# Patient Record
Sex: Male | Born: 1985 | Race: White | Hispanic: No | Smoking: Current every day smoker
Health system: Southern US, Community
[De-identification: ages and names within clinical notes are randomized; demographics above are authoritative.]

## PROBLEM LIST (undated history)

## (undated) DIAGNOSIS — K7200 Acute and subacute hepatic failure without coma: Secondary | ICD-10-CM

## (undated) DIAGNOSIS — K7010 Alcoholic hepatitis without ascites: Secondary | ICD-10-CM

## (undated) DIAGNOSIS — M542 Cervicalgia: Secondary | ICD-10-CM

## (undated) HISTORY — PX: CLEFT PALATE REPAIR: SUR1165

## (undated) HISTORY — PX: NECK SURGERY: SHX720

## (undated) HISTORY — PX: ABDOMINAL SURGERY: SHX537

---
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
## 2006-06-23 ENCOUNTER — Ambulatory Visit: Payer: Self-pay | Admitting: Psychiatry

## 2006-06-23 ENCOUNTER — Inpatient Hospital Stay (HOSPITAL_COMMUNITY): Admission: RE | Admit: 2006-06-23 | Discharge: 2006-06-26 | Payer: Self-pay | Admitting: Psychiatry

## 2013-10-23 ENCOUNTER — Emergency Department (HOSPITAL_COMMUNITY): Payer: Self-pay

## 2013-10-23 ENCOUNTER — Emergency Department (HOSPITAL_COMMUNITY)
Admission: EM | Admit: 2013-10-23 | Discharge: 2013-10-23 | Disposition: A | Discharge status: Home / Self Care | Payer: Self-pay | Attending: Emergency Medicine | Admitting: Emergency Medicine

## 2013-10-23 DIAGNOSIS — S99929A Unspecified injury of unspecified foot, initial encounter: Secondary | ICD-10-CM

## 2013-10-23 DIAGNOSIS — Y92410 Unspecified street and highway as the place of occurrence of the external cause: Secondary | ICD-10-CM | POA: Insufficient documentation

## 2013-10-23 DIAGNOSIS — M25512 Pain in left shoulder: Secondary | ICD-10-CM

## 2013-10-23 DIAGNOSIS — M79605 Pain in left leg: Secondary | ICD-10-CM

## 2013-10-23 DIAGNOSIS — S46909A Unspecified injury of unspecified muscle, fascia and tendon at shoulder and upper arm level, unspecified arm, initial encounter: Secondary | ICD-10-CM | POA: Insufficient documentation

## 2013-10-23 DIAGNOSIS — IMO0002 Reserved for concepts with insufficient information to code with codable children: Secondary | ICD-10-CM | POA: Insufficient documentation

## 2013-10-23 DIAGNOSIS — S99919A Unspecified injury of unspecified ankle, initial encounter: Secondary | ICD-10-CM

## 2013-10-23 DIAGNOSIS — Z79899 Other long term (current) drug therapy: Secondary | ICD-10-CM | POA: Insufficient documentation

## 2013-10-23 DIAGNOSIS — S0990XA Unspecified injury of head, initial encounter: Secondary | ICD-10-CM | POA: Insufficient documentation

## 2013-10-23 DIAGNOSIS — S8990XA Unspecified injury of unspecified lower leg, initial encounter: Secondary | ICD-10-CM | POA: Insufficient documentation

## 2013-10-23 DIAGNOSIS — T07XXXA Unspecified multiple injuries, initial encounter: Secondary | ICD-10-CM | POA: Insufficient documentation

## 2013-10-23 DIAGNOSIS — S4980XA Other specified injuries of shoulder and upper arm, unspecified arm, initial encounter: Secondary | ICD-10-CM | POA: Insufficient documentation

## 2013-10-23 DIAGNOSIS — Y9389 Activity, other specified: Secondary | ICD-10-CM | POA: Insufficient documentation

## 2013-10-23 MED ORDER — OXYCODONE-ACETAMINOPHEN 5-325 MG PO TABS: 1.0000 | ORAL_TABLET | ORAL | Status: DC | PRN

## 2013-10-23 MED ORDER — ONDANSETRON 8 MG PO TBDP
8.0000 mg | ORAL_TABLET | Freq: Once | ORAL | Status: AC
  Administered 2013-10-23: 8 mg via ORAL
  Filled 2013-10-23: qty 1

## 2013-10-23 MED ORDER — HYDROMORPHONE HCL PF 2 MG/ML IJ SOLN
2.0000 mg | Freq: Once | INTRAMUSCULAR | Status: AC
  Administered 2013-10-23: 2 mg via INTRAMUSCULAR
  Filled 2013-10-23: qty 1
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
## 2013-10-23 NOTE — ED Notes (Signed)
Also has pain in nose and facial area and head.
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
## 2013-10-23 NOTE — Discharge Instructions (Signed)

## 2014-01-16 ENCOUNTER — Emergency Department (HOSPITAL_BASED_OUTPATIENT_CLINIC_OR_DEPARTMENT_OTHER): Payer: Self-pay

## 2014-01-16 ENCOUNTER — Emergency Department (HOSPITAL_BASED_OUTPATIENT_CLINIC_OR_DEPARTMENT_OTHER)
Admission: EM | Admit: 2014-01-16 | Discharge: 2014-01-16 | Disposition: A | Discharge status: Home / Self Care | Payer: Self-pay | Attending: Emergency Medicine | Admitting: Emergency Medicine

## 2014-01-16 ENCOUNTER — Encounter (HOSPITAL_BASED_OUTPATIENT_CLINIC_OR_DEPARTMENT_OTHER): Payer: Self-pay | Admitting: Emergency Medicine

## 2014-01-16 DIAGNOSIS — S161XXA Strain of muscle, fascia and tendon at neck level, initial encounter: Secondary | ICD-10-CM | POA: Insufficient documentation

## 2014-01-16 DIAGNOSIS — Y9389 Activity, other specified: Secondary | ICD-10-CM | POA: Insufficient documentation

## 2014-01-16 DIAGNOSIS — Z720 Tobacco use: Secondary | ICD-10-CM | POA: Insufficient documentation

## 2014-01-16 DIAGNOSIS — Z79899 Other long term (current) drug therapy: Secondary | ICD-10-CM | POA: Insufficient documentation

## 2014-01-16 DIAGNOSIS — Y9289 Other specified places as the place of occurrence of the external cause: Secondary | ICD-10-CM | POA: Insufficient documentation

## 2014-01-16 DIAGNOSIS — M62830 Muscle spasm of back: Secondary | ICD-10-CM | POA: Insufficient documentation

## 2014-01-16 DIAGNOSIS — X58XXXA Exposure to other specified factors, initial encounter: Secondary | ICD-10-CM | POA: Insufficient documentation

## 2014-01-16 MED ORDER — OXYCODONE-ACETAMINOPHEN 5-325 MG PO TABS: 1.0000 | ORAL_TABLET | Freq: Four times a day (QID) | ORAL | Status: DC | PRN

## 2014-01-16 MED ORDER — DIAZEPAM 5 MG PO TABS: 5.0000 mg | ORAL_TABLET | Freq: Three times a day (TID) | ORAL | Status: DC | PRN

## 2014-01-16 MED ORDER — DIAZEPAM 5 MG PO TABS
5.0000 mg | ORAL_TABLET | Freq: Once | ORAL | Status: AC
  Administered 2014-01-16: 5 mg via ORAL
  Filled 2014-01-16: qty 1

## 2014-01-16 MED ORDER — HYDROMORPHONE HCL 1 MG/ML IJ SOLN
1.0000 mg | Freq: Once | INTRAMUSCULAR | Status: AC
  Administered 2014-01-16: 1 mg via INTRAMUSCULAR
  Filled 2014-01-16: qty 1

## 2014-01-16 NOTE — ED Provider Notes (Signed)
CSN: [id number]     Arrival date & time 01/16/14  1116 History   First MD Initiated Contact with Patient 01/16/14 1202     Chief Complaint  Patient presents with  . Neck Pain     (Consider location/radiation/quality/duration/timing/severity/associated sxs/prior Treatment) Patient is a 28 y.o. male presenting with neck pain. The history is provided by the patient.  Neck Pain Pain location:  Occipital region, L side and R side Quality:  Aching and cramping Radiates to: down entire back. Pain severity:  Moderate Pain is:  Same all the time Onset quality:  Sudden Timing:  Constant Progression:  Unchanged Chronicity:  New Context comment:  Was on the running boards of a truck rocking the truck back and forth and then felt a jolt of pain in his neck. No head trauma Relieved by:  Nothing Worsened by:  Sneezing, swallowing and twisting Ineffective treatments:  None tried Associated symptoms: no bladder incontinence, no bowel incontinence, no chest pain, no fever, no headaches, no numbness, no visual change and no weakness   Risk factors: no hx of osteoporosis, no hx of spinal trauma, no recent epidural and no recent head injury     History reviewed. No pertinent past medical history. History reviewed. No pertinent past surgical history. History reviewed. No pertinent family history. History  Substance Use Topics  . Smoking status: Current Every Day Smoker  . Smokeless tobacco: Not on file  . Alcohol Use: Not on file    Review of Systems  Constitutional: Negative for fever.  Cardiovascular: Negative for chest pain.  Gastrointestinal: Negative for bowel incontinence.  Genitourinary: Negative for bladder incontinence.  Musculoskeletal: Positive for neck pain.  Neurological: Negative for weakness, numbness and headaches.  All other systems reviewed and are negative.     Allergies  Review of patient's allergies indicates no known allergies.  Home Medications   Prior to  Admission medications   Medication Sig Start Date End Date Taking? Authorizing Provider  cholecalciferol (VITAMIN D) 1000 UNITS tablet Take 1,000 Units by mouth daily.    Historical Provider, MD  CREATINE PO Take by mouth daily.    Historical Provider, MD  Cyanocobalamin (B-12 PO) Take 1 tablet by mouth daily.    Historical Provider, MD  GLUTAMINE PO Take 1 tablet by mouth daily.    Historical Provider, MD  OVER THE COUNTER MEDICATION Take 1 capsule by mouth daily. ADDIUM    Historical Provider, MD  oxyCODONE-acetaminophen (PERCOCET) 5-325 MG per tablet Take 1 tablet by mouth every 4 (four) hours as needed for severe pain. 10/23/13   Flint MelterElliott L Wentz, MD  Pyridoxine HCl (B-6 PO) Take 1 tablet by mouth daily.    Historical Provider, MD   There were no vitals taken for this visit. Physical Exam  Nursing note and vitals reviewed. Constitutional: He is oriented to person, place, and time. He appears well-developed and well-nourished. No distress.  HENT:  Head: Normocephalic and atraumatic.  Mouth/Throat: No oropharyngeal exudate.  Eyes: EOM are normal. Pupils are equal, round, and reactive to light.  Neck: Normal range of motion. Neck supple.  Cardiovascular: Normal rate and regular rhythm.  Exam reveals no friction rub.   No murmur heard. Pulmonary/Chest: Effort normal and breath sounds normal. No respiratory distress. He has no wheezes. He has no rales.  Abdominal: He exhibits no distension. There is no tenderness. There is no rebound.  Musculoskeletal: He exhibits no edema.       Cervical back: He exhibits decreased range of motion,  tenderness, bony tenderness and spasm.       Thoracic back: He exhibits decreased range of motion, tenderness, bony tenderness and spasm.  Neurological: He is alert and oriented to person, place, and time. No cranial nerve deficit. He exhibits normal muscle tone. Coordination normal.  Skin: Skin is warm. He is not diaphoretic.    ED Course  Procedures  (including critical care time) Labs Review Labs Reviewed - No data to display  Imaging Review Dg Thoracic Spine 4v  01/16/2014   CLINICAL DATA:  Upper back pain after something fell from shelf and hit him.  EXAM: THORACIC SPINE - 4+ VIEW  COMPARISON:  None.  FINDINGS: There is no evidence of thoracic spine fracture. Alignment is normal. No other significant bone abnormalities are identified.  IMPRESSION: Normal thoracic spine.   Electronically Signed   By: Roque LiasJames  Green M.D.   On: 01/16/2014 14:00   Ct Cervical Spine Wo Contrast  01/16/2014   CLINICAL DATA:  Patient states that he was getting something from over his head and the weight came down pretty fast and heavy causing immediate neck pains, has increased pains at base of skull, upper neck, hurts worse to be upright, no other complaints  EXAM: CT CERVICAL SPINE WITHOUT CONTRAST  TECHNIQUE: Multidetector CT imaging of the cervical spine was performed without intravenous contrast. Multiplanar CT image reconstructions were also generated.  COMPARISON:  None.  FINDINGS: The alignment is anatomic. The vertebral body heights are maintained. There is no acute fracture. There is no static listhesis. The prevertebral soft tissues are normal. The intraspinal soft tissues are not fully imaged on this examination due to poor soft tissue contrast, but there is no gross soft tissue abnormality.  The disc spaces are maintained.  The visualized portions of the lung apices demonstrate no focal abnormality.  IMPRESSION: No acute osseous injury of the cervical spine.   Electronically Signed   By: Elige KoHetal  Patel   On: 01/16/2014 13:36     EKG Interpretation None      MDM   Final diagnoses:  Neck strain, initial encounter    15M with neck injury. Was rocking a pickup truck and felt a jolt of pain. Pain radiates down into his spine. No difficulty breathing, no numbness/weakess. AFVSS. NVI in all extremities. Spasms present in bilateral traps, has midline bony  pain. Will xray his T-spine and CT his c-spine. All imaging ok. Per nursing, patient sitting up and eating [employer] without problem. Patient tells me he continues to have pain with movements. Will leave in c-collar. [facility], but he declined. I cautioned him he cannot work while under the influence of the meds I will give him. Patient going home, given pain meds, muscle relaxers, resource guide to help establish a PCP.  Elwin MochaBlair Shene Maxfield, MD 01/16/14 [phone]

## 2014-01-16 NOTE — Discharge Instructions (Signed)
Soft Tissue Injury of the Neck °A soft tissue injury of the neck may be either blunt or penetrating. A blunt injury does not break the skin. A penetrating injury breaks the skin, creating an open wound. Blunt injuries may happen in several ways. Most involve some type of direct blow to the neck. This can cause serious injury to the windpipe, voice box, cervical spine, or esophagus. In some cases, the injury to the soft tissue can also result in a break (fracture) of the cervical spine.  °Soft tissue injuries of the neck require immediate medical care. Sometimes, you may not notice the signs of injury right away. You may feel fine at first, but the swelling may eventually close off your airway. This could result in a significant or life-threatening injury. This is rare, but it is important to keep in mind with any injury to the neck.  °CAUSES  °Causes of blunt injury may include: °· "Clothesline" injuries. This happens when someone is moving at high speed and runs into a clothesline, outstretched arm, or similar object. This results in a direct injury to the front of the neck. If the airway is blocked, it can cause suffocation due to lack of oxygen (asphyxiation) or even instant death. °· High-energy trauma. This includes injuries from motor vehicle crashes, falling from a great height, or heavy objects falling onto the neck. °· Sports-related injuries. Injury to the windpipe and voice box can result from being struck by another player or being struck by an object, such as a baseball, hockey stick, or an outstretched arm. °· Strangulation. This type of injury may cause skin trauma, hoarseness of voice, or broken cartilage in the voice box or windpipe. It may also cause a serious airway problem. °SYMPTOMS  °· Bruising. °· Pain and tenderness in the neck. °· Swelling of the neck and face. °· Hoarseness of voice. °· Pain or difficulty with swallowing. °· Drooling or inability to swallow. °· Trouble breathing. This may  become worse when lying flat. °· Coughing up blood. °· High-pitched, harsh, vibratory noise due to partial obstruction of the windpipe (stridor). °· Swelling of the upper arms. °· Windpipe that appears to be pushed off to one side. °· Air in the tissues under the skin of the neck or chest (subcutaneous emphysema). This usually indicates a problem with the normal airway and is a medical emergency. °DIAGNOSIS  °· If possible, your caregiver may ask about the details of how the injury occurred. A detailed exam can help to identify specific areas of the neck that are injured. °· Your caregiver may ask for tests to rule out injury of the voice box, airway, or esophagus. This may include X-rays, ultrasounds, CT scans, or MRI scans, depending on the severity of your injury. °TREATMENT  °If you have an injury to your windpipe or voice box, immediate medical care is required. In almost all cases, hospitalization is necessary. For injuries that do not appear to require surgery, it is helpful to have medical observation for 24 hours. You may be asked to do one or more of the following: °· Rest your voice. °· Bed rest. °· Limit your diet, depending on the extent of the injury. Follow your caregiver's dietary guidelines. Often, only fluids and soft foods are recommended. °· Keep your head raised. °· Breathe humidified air. °· Take medicines to control infection, reduce swelling, and reduce normal stomach acid. You may also need pain medicine, depending on your injury. °For injuries that appear to require surgery,   you will need to stay in the hospital. The exact type of procedure needed will depend on your exact injury or injuries.  °HOME CARE INSTRUCTIONS  °· If the skin was broken, keep the wound area clean and dry. Wear your bandage (dressing) and care for your wound as instructed. °· Follow your caregiver's advice about your diet. °· Follow your caregiver's advice about use of your voice. °· Take medicines as  directed. °· Keep your head and neck at least partially raised (elevated) while recovering. This should also be done while sleeping. °SEEK MEDICAL CARE IF:  °· Your voice becomes weaker. °· Your swelling or bruising is not improving as expected. Typically, this takes several days to improve. °· You feel that you are having problems with medicines prescribed. °· You have drainage from the injury site. This may be a sign that your wound is not healing properly or is infected. °· You develop increasing pain or difficulty while swallowing. °· You develop an oral temperature of 102° F (38.9° C) or higher. °SEEK IMMEDIATE MEDICAL CARE IF:  °· You cough up blood. °· You develop sudden trouble breathing. °· You cannot tolerate your oral medicines, or you are unable to swallow. °· You develop drooling. °· You have new or worsening vomiting. °· You develop sudden, new swelling of the neck or face. °· You have an oral temperature above 102° F (38.9° C), not controlled by medicine. °MAKE SURE YOU: °· Understand these instructions. °· Will watch your condition. °· Will get help right away if you are not doing well or get worse. °Document Released: 06/28/2007 Document Revised: 06/13/2011 Document Reviewed: 06/07/2010 °ExitCare® Patient Information ©2015 [employer]. This information is not intended to replace advice given to you by your health care provider. Make sure you discuss any questions you have with your health care provider. ° ° °Emergency Department Resource Guide °1) Find a Doctor and Pay Out of Pocket °Although you won't have to find out who is covered by your insurance plan, it is a good idea to ask around and get recommendations. You will then need to call the office and see if the doctor you have chosen will accept you as a new patient and what types of options they offer for patients who are self-pay. Some doctors offer discounts or will set up payment plans for their patients who do not have insurance, but you  will need to ask so you aren't surprised when you get to your appointment. ° °2) Contact Your Local Health Department °Not all health departments have doctors that can see patients for sick visits, but many do, so it is worth a call to see if yours does. If you don't know where your local health department is, you can check in your phone book. The CDC also has a tool to help you locate your state's health department, and many state websites also have listings of all of their local health departments. ° °3) Find a Walk-in Clinic °If your illness is not likely to be very severe or complicated, you may want to try a walk in clinic. These are popping up all over the country in pharmacies, drugstores, and shopping centers. They're usually staffed by nurse practitioners or physician assistants that have been trained to treat common illnesses and complaints. They're usually fairly quick and inexpensive. However, if you have serious medical issues or chronic medical problems, these are probably not your best option. ° °No Primary Care Doctor: °- Call [facility] at    832-8000 - they can help you locate a primary care doctor that  accepts your insurance, provides certain services, etc. °- Physician Referral Service- 1-800-533-3463 ° °Chronic Pain Problems: °Organization         Address  Phone   Notes  °[facility]  (336) 297-2271 Patients need to be referred by their primary care doctor.  ° °Medication Assistance: °Organization         Address  Phone   Notes  °[place] [facility] [address] °[place], [place] [address] (336) 641-8030 --Must be a resident of [place] °-- Must have NO insurance coverage whatsoever (no Medicaid/ Medicare, etc.) °-- The pt. MUST have a primary care doctor that directs their care regularly and follows them in the community °  °[facility]  (866) 331-1348   °[employer]  (888) 892-1162   ° °Agencies that provide inexpensive medical  care: °Organization         Address  Phone   Notes  °[facility]  (336) 832-8035   °[facility]    (336) 832-7272   °[facility] [address] °[place], [place] [address] (336) 832-4777   °[facility] [address], °[place] (336) 271-4999   °[facility]    (336) 373-0678   °[facility]    (336) 272-1050   °[facility] ° [address], [place] Phone:  (336) 832-4444, Fax:  (336) 832-4440 Hours of Operation:  9 am - 6 pm, M-F.  Also accepts Medicaid/Medicare and self-pay.  °[facility] ° [address], [place] Phone: (336) 832-3150, Fax: (336) 832-3151. Hours of Operation:  8:30 am - 5:30 pm, M-F.  Also accepts Medicaid and self-pay.  °[facility] [place] [address], [place] Phone: (336) 878-6027   °[facility] [address], [place], [place] (336)723-1848, Ext. 123 Mondays & Thursdays: 7-9 AM.  First 15 patients are seen on a first come, first serve basis. °  ° °Medicaid-accepting [place] Providers: ° °Organization         Address  Phone   Notes  °[facility] [address], [place] (336) 641-2100 Also accepts self-pay patients.  °[facility] [address], [place] ° (336) 856-9996   °[facility] [address], [place] (336) 288-8857   °[facility] [address] [place] [address], [place] (336) 299-7000   °Veita Bland [address], [place]  ° (336) 373-1557 Only accepts [place] Access Medicaid patients after they have their name applied to their card.  ° °Self-Pay (no insurance) in [place]: ° °Organization         Address  Phone   Notes  °Sickle Cell Patients, [facility] [address], [place] (336) 832-1970   °[facility] [address],  [place] (336) 832-4400   °[facility] ° [address], [place] (336) 992-4800   °[facility]/Dr. Osei-Bonsu ° [address] [place] [address], [place] or [address], [place] (336) [phone] Phone number for both [place] and [place] locations is the same.  °[facility] [address], [place] (336) 299-0000   °[facility] [address] [place] [address],    or [address] (336) 852-7530 °(336) 878-2260   °[facility] [address], [place] (336) 350-1642, phone; (336) 294-5005, fax Sees patients 1st and 3rd Saturday of every month.  Must not qualify for public or private insurance (i.e. Medicaid, Medicare, [place] Health Choice, Veterans' Benefits) • Household income should be no more than 200% of the poverty level •The clinic cannot treat you if you are pregnant or think you are pregnant • Sexually transmitted diseases are not treated at the clinic.  ° ° °Dental Care: °Organization         Address  Phone  Notes  °[facility] [address], [place] (336) 641-6152 Accepts children up to age 21 who are enrolled in Medicaid or [place] Health Choice; pregnant women with a Medicaid card; and children who have applied for Medicaid or [place] Health Choice, but were declined, whose parents can pay a reduced fee at time of service.  °[facility] [place]  [address], [place] (336) 641-7733 Accepts children up to age 21 who are enrolled in Medicaid or [place] Health Choice; pregnant women with a Medicaid card; and children who have applied for Medicaid or [place] Health Choice, but were declined, whose parents can pay a reduced fee at time of service.  °[facility] ° [address], [place] (336) 641-4533 Patients are seen by appointment only. Walk-ins are not accepted. [facility] will see patients 18 years of age and older. °Monday - Tuesday (8am-5pm) °Most Wednesdays (8:30-5pm) °$30 per visit, cash only  °[facility] ° [address], [place] (336) 641-4533 Patients are seen by appointment only. Walk-ins are not accepted. [facility] will see patients 18 years of age and older. °One Wednesday Evening (Monthly: Volunteer Based).  $30 per visit, cash only  °[facility]  (919) 537-3737 for adults; Children under age 4, call Graduate Pediatric Dentistry at ([phone]) [phone]. Children aged 4-14, please call (919) [phone] to request a pediatric application. ° Dental services are provided in all areas of dental care including fillings, crowns and bridges, complete and partial dentures, implants, gum treatment, root canals, and extractions. Preventive care is also provided. Treatment is provided to both adults and children. °Patients are selected via a lottery and there is often a waiting list. °  °[facility] [address], ° ° (336) 763-8833 [email or web address] °  °[facility] [address], [place], [place] (336)723-1848, Ext. 123 Second and Fourth Thursday of each month, opens at 6:30 AM; Clinic ends at 9 AM.  Patients are seen on a first-come first-served basis, and a limited number are seen during each clinic.  ° °[facility] ° [address], [place], [place] (336) 723-7904   Eligibility Requirements °You must have lived in [place], [place], or [place] counties for at least the last three months. °  You cannot be eligible for state or federal sponsored healthcare insurance, including Veterans Administration, Medicaid, or Medicare. °  You generally cannot be eligible for healthcare insurance through your employer.  °  How to apply: °Eligibility screenings are held every Tuesday and Wednesday afternoon from 1:00 pm until 4:00 pm. You do not need an appointment for the interview!   °[facility] [address], [place], [place] 336-631-2330   °[facility]  336-342-8273   °  [facility]  336-703-3100   °[facility]  336-570-6415   ° °Behavioral Health Resources in the Community: °Intensive Outpatient Programs °Organization         Address  Phone  Notes  °[facility] [address], [place], [place] 336-878-6098   °[facility] [address], [place], [place] 336-832-9800   °[facility] [address], [place], [place] ° 336-882-2125   °[facility] [address],  °[place], [place] 1-800-853-5163 or 336-641-4981   °Substance Abuse Resources °Organization         Address  Phone  Notes  °[facility]  336-882-2125   °[facility]  336-784-9470   °[facility]  336-285-9073   °[facility]  336-845-3988   °Residential & Outpatient Substance Abuse Program  1-800-659-3381   °Psychological Services °Organization         Address  Phone  Notes  °[facility]  336- 832-9600   °[facility]  336- 378-7881   °[facility] [address], [place] 1-800-853-5163 or 336-641-4981   ° °Mobile Crisis Teams °Organization         Address  Phone  Notes  °[facility], Mobile Crisis Care Unit  1-877-626-1772   °Assertive °[facility] ° [address] [place], [place] 336-834-9664   °Sharon DeEsch [address] °[place] 336-554-5454   ° °Self-Help/Support Groups °Organization         Address  Phone             Notes  °[facility] [place] - variety of support groups  336- 373-1402 Call for more information  °Narcotics Anonymous (NA), [facility] [address], °[place]  2 meetings at this location  ° °Residential Treatment Programs °Organization         Address  Phone  Notes  °[facility] [address],    °[place]  1-866-801-8205   °[facility] ° [address], [place], [place] 704-293-8524   °[facility] [address], [place] 336-845-3988 Admissions: 8am-3pm M-F  °[facility] [address]    °[place], [place] 336-841-1104   °[facility] [address], [place], [place] 336-379-7146   °[facility] [address]  °[place], [place] 336-285-9073   °[facility] Intensive Outpatient [address], [place], [place] 336-852-3033   °[facility] [address]  °[place], [place] 1-877-615-2722 or 336-784-9470   °[facility] [address] [place], [place] 336-227-7417 Accepts Medicaid  °[facility] [address]  °[place] 1-800-659-3381 Substance Abuse/Addiction Treatment  ° °[place] Behavioral Health Resources °Organization         Address  Phone  Notes  °[facility]  (888) 581-9988   °Julie Brannon, PhD [address] [place], [place]   (336) 349-5553 or (336) 951-0000   °[facility]   [address] °[place], [place] (336) 349-4454   °[facility] [address], [place], [place] (336) 342-8316 Insurance/Medicaid/sponsorship through [facility]  °[facility] [address]                                      [place], [place] (336) 342-8316 Therapy/tele-psych/case  °[facility] [address]  ° [place], [place] (336) 349-2233    °Dr. Arfeen  (336) 349-4544   °[facility] [place]  [employer] [place] [facility] 1) [address], [place] °2) [address], [place] °3)  [address], [place] (336) 349-3220 °(336) 342-7768 ° °(336) 342-8140   °[place] [facility] (336) 342-1394 or (336) 342-3537 (After Hours)    ° ° ° °

## 2014-01-16 NOTE — ED Notes (Signed)
Pt to room 7 in w/c, pt reports lifting heavy box last night, has had neck pain since then.

## 2014-01-21 ENCOUNTER — Encounter (HOSPITAL_COMMUNITY): Payer: Self-pay | Admitting: Emergency Medicine

## 2014-01-21 ENCOUNTER — Emergency Department (HOSPITAL_COMMUNITY): Payer: Self-pay

## 2014-01-21 ENCOUNTER — Emergency Department (HOSPITAL_COMMUNITY)
Admission: EM | Admit: 2014-01-21 | Discharge: 2014-01-21 | Disposition: A | Discharge status: Home / Self Care | Payer: Self-pay | Attending: Emergency Medicine | Admitting: Emergency Medicine

## 2014-01-21 DIAGNOSIS — S29002D Unspecified injury of muscle and tendon of back wall of thorax, subsequent encounter: Secondary | ICD-10-CM | POA: Insufficient documentation

## 2014-01-21 DIAGNOSIS — S199XXD Unspecified injury of neck, subsequent encounter: Secondary | ICD-10-CM | POA: Insufficient documentation

## 2014-01-21 DIAGNOSIS — M5020 Other cervical disc displacement, unspecified cervical region: Secondary | ICD-10-CM | POA: Insufficient documentation

## 2014-01-21 DIAGNOSIS — Z720 Tobacco use: Secondary | ICD-10-CM | POA: Insufficient documentation

## 2014-01-21 DIAGNOSIS — X58XXXD Exposure to other specified factors, subsequent encounter: Secondary | ICD-10-CM | POA: Insufficient documentation

## 2014-01-21 DIAGNOSIS — Z79899 Other long term (current) drug therapy: Secondary | ICD-10-CM | POA: Insufficient documentation

## 2014-01-21 MED ORDER — OXYCODONE-ACETAMINOPHEN 5-325 MG PO TABS
2.0000 | ORAL_TABLET | Freq: Once | ORAL | Status: AC
  Administered 2014-01-21: 2 via ORAL
  Filled 2014-01-21: qty 2

## 2014-01-21 MED ORDER — KETOROLAC TROMETHAMINE 30 MG/ML IJ SOLN
30.0000 mg | Freq: Once | INTRAMUSCULAR | Status: AC
  Administered 2014-01-21: 30 mg via INTRAMUSCULAR
  Filled 2014-01-21: qty 1

## 2014-01-21 MED ORDER — OXYCODONE-ACETAMINOPHEN 5-325 MG PO TABS: 1.0000 | ORAL_TABLET | ORAL | Status: DC | PRN

## 2014-01-21 NOTE — Discharge Instructions (Signed)
Return to the emergency room with worsening of symptoms, new symptoms or with symptoms that are concerning, especially fevers, loss of control of bladder or bowels, numbness or tingling around genital region or anus, weakness. RICE: Rest, Ice (three cycles of 20 mins on, 20mins off at least twice a day), compression/brace, elevation. Heating pad works well for back pain. Ibuprofen 400mg  (2 tablets 200mg ) every 5-6 hours for 3-5 days and then as needed for pain. Percocet for severe pain. Do not operate machinery, drive or drink alcohol while taking narcotics or muscle relaxers. Call to make appointment with neurosurgeon as soon as possible. Information provided above.

## 2014-01-21 NOTE — ED Notes (Signed)
Pt walked out; staff unaware; no clothes or belongings left in room
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
## 2014-01-21 NOTE — ED Notes (Signed)
Pt ambulated to restroom with steady gait.

## 2014-01-21 NOTE — ED Provider Notes (Signed)
CSN: [id number]     Arrival date & time 01/21/14  1042 History   First MD Initiated Contact with Patient 01/21/14 1156     Chief Complaint  Patient presents with  . Back Pain  . Neck Pain     (Consider location/radiation/quality/duration/timing/severity/associated sxs/prior Treatment) HPI 28 year old male who suffered a neck and back injury on 10/15 in which [facility]. He reports rocking a pickup truck and felt a jolt of pain that started from his neck and radiate down his spine. An x-ray of T-spine and a CT of his C-spine shows no acute finding. Patient was sent home with pain medication, and a c-collar, and recommendation for an MRI of the neck to rule out ligamentous injury. Patient declined MRI initially. He has been wearing his c-collar throughout the day and remove at nighttime. He continues to endorse sharp shooting pain from the base of his neck that spread out to both shoulder, worsening with neck movement including neck rotation, flexion and extension. Even sneezing or coughing aggravates his pain. He has been taking muscle relaxant and pain medication with some improvement. Patient is here with concern that his symptom has not improved as expected. He cannot perform his job with this neck pain. He denies any new numbness or weakness, or dropping objects. No fever, productive cough, trouble swallowing, chest pain or shortness of breath.      History reviewed. No pertinent past medical history. History reviewed. No pertinent past surgical history. History reviewed. No pertinent family history. History  Substance Use Topics  . Smoking status: Current Every Day Smoker  . Smokeless tobacco: Not on file  . Alcohol Use: Not on file    Review of Systems  Constitutional: Negative for fever.  Musculoskeletal: Positive for neck pain and neck stiffness.  Skin: Negative for rash and wound.  Neurological: Negative for numbness and headaches.       Allergies  Review of patient's allergies indicates no known allergies.  Home Medications   Prior to Admission medications   Medication Sig Start Date End Date Taking? Authorizing Provider  diazepam (VALIUM) 5 MG tablet Take 1 tablet (5 mg total) by mouth every 8 (eight) hours as needed for muscle spasms. 01/16/14  Yes Elwin MochaBlair Walden, MD  Menthol, Topical Analgesic, (ICY HOT) 5 % PADS Apply 1 each topically as needed (for pain).   Yes Historical Provider, MD  oxyCODONE-acetaminophen (PERCOCET/ROXICET) 5-325 MG per tablet Take 0.5-1 tablets by mouth every 6 (six) hours as needed for severe pain.   Yes Historical Provider, MD   BP 139/97  Pulse 80  Temp(Src) 98 F (36.7 C) (Oral)  Resp 18  Ht 5\' 10"  (1.778 m)  Wt 204 lb (92.534 kg)  BMI 29.27 kg/m2  SpO2 100% Physical Exam  Constitutional: He appears well-developed and well-nourished. No distress.  HENT:  Head: Atraumatic.  Eyes: Conjunctivae and EOM are normal. Pupils are equal, round, and reactive to light.  Neck: Neck supple.  Neck is stiff, left paracervical tenderness to palpation and tenderness along the left trapezius muscle. Decrease neck flexion extension and rotation secondary to pain. No overlying skin changes. No significant midline spine tenderness crepitus or step-off.  Cardiovascular: Intact distal pulses.   Neurological: He is alert.  Normal grip strength bilaterally  Skin: No rash noted.  Psychiatric: He has a normal mood and affect.    ED Course  Procedures (including critical care time)  12:11 PM Patient here with persistent  neck pain status post injury 5 days ago. He was recommended to have a neck MRI initially but declined because he has to take care of his kids. He is here because his neck pain is still significant. We'll obtain a neck MRI as recommended. Pain medication given. Patient is currently neurovascularly intact.    3:14 PM Currently awaits neck MRI.  I have called MRI tech who notify that  it will be 2 more hrs before pt can go to MRI.  Pt made aware of time delay.  Pt currently resting comfortably.  Pain medication offered.    Labs Review Labs Reviewed - No data to display  Imaging Review Mr Cervical Spine Wo Contrast  01/21/2014   CLINICAL DATA:  Patient was on the running board of a truck rocking the truck back and forth and felt a sudden jolt of pain in his neck on 01/15/2014. Neck and back pain since. Rule out ligamentous injury.  EXAM: MRI CERVICAL SPINE WITHOUT CONTRAST  TECHNIQUE: Multiplanar, multisequence MR imaging of the cervical spine was performed. No intravenous contrast was administered.  COMPARISON:  Cervical spine CT 01/16/2014  FINDINGS: There is straightening of the normal cervical lordosis, which may be positional or due to muscle spasm. There is no listhesis. Vertebral body heights are preserved. Mild multilevel disc desiccation is present. No definite ALL or PLL disruption is identified. No soft tissue edema suggestive of posterior ligamentous complex injury is identified. Vertebral bone marrow signal is within normal limits without evidence of edema. Craniocervical junction is unremarkable. Cervical spinal cord is normal in caliber and signal. Prevertebral soft tissues are mildly prominent from C5-C7, unchanged.  C2-3 through C4-5:  Negative.  C5-6: Shallow, broad posterior disc protrusion and mild uncovertebral spurring without stenosis.  C6-7: Small central disc protrusion approaches but does not contact or deform the spinal cord and does not result in stenosis.  C7-T1:  Negative.  IMPRESSION: 1. No acute ligamentous injury identified. 2. Small disc protrusions at C5-6 and C6-7 without stenosis.   Electronically Signed   By: Sebastian AcheAllen  Grady   On: 01/21/2014 18:13     EKG Interpretation None      MDM   Final diagnoses:  Neck injury, subsequent encounter  Protruded cervical disc    BP 142/82  Pulse 81  Temp(Src) 98 F (36.7 C) (Oral)  Resp 14  Ht 5\' 10"   (1.778 m)  Wt 204 lb (92.534 kg)  BMI 29.27 kg/m2  SpO2 95%  I have reviewed nursing notes and vital signs. I personally reviewed the imaging tests through PACS system  I reviewed available ER/hospitalization records thought the EMR     Fayrene HelperBowie Promise Weldin, [place]PA-C 01/23/14 1159

## 2014-01-23 NOTE — ED Provider Notes (Signed)
Medical screening examination/treatment/procedure(s) were performed by non-physician practitioner and as supervising physician I was immediately available for consultation/collaboration.   EKG Interpretation None        Samuel JesterKathleen Lehua Flores, DO 01/23/14 1733

## 2014-01-24 NOTE — ED Provider Notes (Signed)
Medical screening examination/treatment/procedure(s) were performed by non-physician practitioner and as supervising physician I was immediately available for consultation/collaboration.   EKG Interpretation None       Vanetta MuldersScott Jaryn Rosko, MD 01/24/14 [phone]

## 2014-10-28 ENCOUNTER — Encounter (HOSPITAL_BASED_OUTPATIENT_CLINIC_OR_DEPARTMENT_OTHER): Payer: Self-pay

## 2014-10-28 ENCOUNTER — Emergency Department (HOSPITAL_BASED_OUTPATIENT_CLINIC_OR_DEPARTMENT_OTHER)
Admission: EM | Admit: 2014-10-28 | Discharge: 2014-10-28 | Disposition: A | Discharge status: Home / Self Care | Payer: Self-pay | Attending: Emergency Medicine | Admitting: Emergency Medicine

## 2014-10-28 DIAGNOSIS — M542 Cervicalgia: Secondary | ICD-10-CM | POA: Insufficient documentation

## 2014-10-28 DIAGNOSIS — G8929 Other chronic pain: Secondary | ICD-10-CM | POA: Insufficient documentation

## 2014-10-28 DIAGNOSIS — Z720 Tobacco use: Secondary | ICD-10-CM | POA: Insufficient documentation

## 2014-10-28 HISTORY — DX: Cervicalgia: M54.2

## 2014-10-28 MED ORDER — METHOCARBAMOL 500 MG PO TABS: 1000.0000 mg | ORAL_TABLET | Freq: Four times a day (QID) | ORAL | Status: DC | PRN

## 2014-10-28 MED ORDER — KETOROLAC TROMETHAMINE 60 MG/2ML IM SOLN
30.0000 mg | Freq: Once | INTRAMUSCULAR | Status: AC
  Administered 2014-10-28: 30 mg via INTRAMUSCULAR
  Filled 2014-10-28: qty 2

## 2014-10-28 MED ORDER — OXYCODONE HCL 5 MG PO TABS
5.0000 mg | ORAL_TABLET | Freq: Once | ORAL | Status: AC
  Administered 2014-10-28: 5 mg via ORAL
  Filled 2014-10-28: qty 1

## 2014-10-28 MED ORDER — METHOCARBAMOL 500 MG PO TABS
1000.0000 mg | ORAL_TABLET | Freq: Once | ORAL | Status: AC
  Administered 2014-10-28: 1000 mg via ORAL
  Filled 2014-10-28: qty 2

## 2014-10-28 MED ORDER — OXYCODONE HCL 5 MG PO TABS
2.5000 mg | ORAL_TABLET | Freq: Four times a day (QID) | ORAL | Status: DC | PRN
Start: 2014-10-28 — End: 2016-12-04

## 2014-10-28 NOTE — ED Notes (Addendum)
C/o posterior neck pain after a certain movement 2 days ago at work-pain worse with movement-states hx of same approx 9 mos ago with ortho f/u

## 2014-10-28 NOTE — ED Provider Notes (Signed)
CSN: [id number]     Arrival date & time 10/28/14  1509 History   First MD Initiated Contact with Patient 10/28/14 1556     Chief Complaint  Patient presents with  . Neck Pain     (Consider location/radiation/quality/duration/timing/severity/associated sxs/prior Treatment) HPI   Blood pressure 141/80, pulse 99, temperature 98.6 F (37 C), temperature source Oral, resp. rate 18, height 5\' 10"  (1.778 m), weight 223 lb (101.152 kg), SpO2 100 %.  Jeffrey Braun is a 29 y.o. male complaining of severe posterior C-spine pain. Patient states he's had issues with his neck ever since he was rocking a large vehicle several months ago. He denies any numbness, weakness, the pain radiates down to the upper back. No pain medication taken prior to arrival. 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 He also just moved. This pain started 2 days ago. He states that he saw a orthopedist for this in the past but cannot remember the name.  Past Medical History  Diagnosis Date  . Neck pain    Past Surgical History  Procedure Laterality Date  . Cleft palate repair    . Abdominal surgery     No family history on file. History  Substance Use Topics  . Smoking status: Current Every Day Smoker  . Smokeless tobacco: Not on file  . Alcohol Use: Yes    Review of Systems  10 systems reviewed and found to be negative, except as noted in the HPI.   Allergies  Review of patient's allergies indicates no known allergies.  Home Medications   Prior to Admission medications   Medication Sig Start Date End Date Taking? Authorizing Provider  methocarbamol (ROBAXIN) 500 MG tablet Take 2 tablets (1,000 mg total) by mouth 4 (four) times daily as needed (Pain). 10/28/14   Jemel Ono, PA-C  oxyCODONE (ROXICODONE) 5 MG immediate release tablet Take 0.5-1 tablets (2.5-5 mg total) by mouth every 6 (six) hours as needed. 10/28/14   Akane Tessier, PA-C   BP 141/80  mmHg  Pulse 99  Temp(Src) 98.6 F (37 C) (Oral)  Resp 18  Ht 5\' 10"  (1.778 m)  Wt 223 lb (101.152 kg)  BMI 32.00 kg/m2  SpO2 100% Physical Exam  Constitutional: He is oriented to person, place, and time. He appears well-developed and well-nourished. No distress.  HENT:  Head: Normocephalic.  Eyes: Conjunctivae and EOM are normal. Pupils are equal, round, and reactive to light.  Neck:    No midline C-spine  tenderness to palpation or step-offs appreciated. Patient has full range of motion without pain.  Grip strength, biceps, triceps 5/5 bilaterally;  can differentiate between pinprick and light touch bilaterally  Reduced  range of motion and C-spine flexion bilaterally.   Cardiovascular: Normal rate.   Pulmonary/Chest: Effort normal. No stridor.  Musculoskeletal: Normal range of motion.  Neurological: He is alert and oriented to person, place, and time.  Psychiatric: He has a normal mood and affect.  Nursing note and vitals reviewed.   ED Course  Procedures (including critical care time) Labs Review Labs Reviewed - No data to display  Imaging Review No results found.   EKG Interpretation None      MDM   Final diagnoses:  Cervicalgia   Filed Vitals:   10/28/14 1519  BP: 141/80  Pulse: 99  Temp: 98.6 F (37 C)  TempSrc: Oral  Resp: 18  Height: 5\' 10"  (1.778 m)  Weight: 223 lb (101.152 kg)  SpO2: 100%    Medications  oxyCODONE (Oxy IR/ROXICODONE) immediate release tablet 5 mg (5 mg Oral Given 10/28/14 1615)  methocarbamol (ROBAXIN) tablet 1,000 mg (1,000 mg Oral Given 10/28/14 1615)  ketorolac (TORADOL) injection 30 mg (30 mg Intramuscular Given 10/28/14 1615)    Jeffrey Braun is a pleasant 29 y.o. male presenting with exacerbation of his chronic neck pain, pain is made worse with movement. His neuro exam is nonfocal, he has full strength and sensation to the bilateral upper extremities. 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 We've had an extensive discussion on pain medication at that he can't have any narcotics or muscle relaxers while he's working. Patient verbalized his understanding.  Evaluation does not show pathology that would require ongoing emergent intervention or inpatient treatment. Pt is hemodynamically stable and mentating appropriately. Discussed findings and plan with patient/guardian, who agrees with care plan. All questions answered. Return precautions discussed and outpatient follow up given.   New Prescriptions   METHOCARBAMOL (ROBAXIN) 500 MG TABLET    Take 2 tablets (1,000 mg total) by mouth 4 (four) times daily as needed (Pain).   OXYCODONE (ROXICODONE) 5 MG IMMEDIATE RELEASE TABLET    Take 0.5-1 tablets (2.5-5 mg total) by mouth every 6 (six) hours as needed.         Wynetta Emery, PA-C 10/28/14 1631  Rolland Porter, MD 11/01/14 [phone]

## 2014-10-28 NOTE — Discharge Instructions (Signed)
For pain control you may take:  800mg  of ibuprofen (that is usually 4 over the counter pills)  3 times a day (take with food) and acetaminophen 975mg  (this is 3 over the counter pills) four times a day. Do not drink alcohol or combine with other medications that have acetaminophen as an ingredient (Read the labels!).  F  Take oxycodone/Robaxin for breakthrough pain, do not drink alcohol, drive, care for children or do other critical tasks while taking oxycodone/Robaxin.  Do not hesitate to return to the emergency room for any new, worsening or concerning symptoms.  Please obtain primary care using resource guide below. Let them know that you were seen in the emergency room and that they will need to obtain records for further outpatient management.    Emergency Department Resource Guide 1) Find a Doctor and Pay Out of Pocket Although you won't have to find out who is covered by your insurance plan, it is a good idea to ask around and get recommendations. You will then need to call the office and see if the doctor you have chosen will accept you as a new patient and what types of options they offer for patients who are self-pay. Some doctors offer discounts or will set up payment plans for their patients who do not have insurance, but you will need to ask so you aren't surprised when you get to your appointment.  2) Contact Your Local Health Department Not all health departments have doctors that can see patients for sick visits, but many do, so it is worth a call to see if yours does. If you don't know where your local health department is, you can check in your phone book. The CDC also has a tool to help you locate your state's health department, and many state websites also have listings of all of their local health departments.  3) Find a Walk-in Clinic If your illness is not likely to be very severe or complicated, you may want to try a walk in clinic. These are popping up all over the country  in pharmacies, drugstores, and shopping centers. They're usually staffed by nurse practitioners or physician assistants that have been trained to treat common illnesses and complaints. They're usually fairly quick and inexpensive. However, if you have serious medical issues or chronic medical problems, these are probably not your best option.  No Primary Care Doctor: - Call Health Connect at  [phone] - they can help you locate a primary care doctor that  accepts your insurance, provides certain services, etc. - Physician Referral Service- [phone]  Chronic Pain Problems: Organization         Address  Phone   Notes  [facility]  [phone] Patients need to be referred by their primary care doctor.   Medication Assistance: Organization         Address  Phone   Notes  [facility] Medication [facility] [address] [place] [place], [place] [address] [phone] --Must be a resident of [facility] -- Must have NO insurance coverage whatsoever (no Medicaid/ Medicare, etc.) -- The pt. MUST have a primary care doctor that directs their care regularly and follows them in the community   [facility]ist  [phone]   [employer]  [phone]    Agencies that provide inexpensive medical care: Organization         Address  Phone   Notes  [facility]  [phone]  [facility]    [phone]   [facility] [place], [place] [address] [phone]   [place]. [address] [place] [phone]   Planned Parenthood    [phone]   [facility]    [phone]   [place] and [place] [address], [place] Phone:  [phone], Fax:  [phone] Hours of Operation:  9 am - 6 pm, M-F.  Also accepts Medicaid/Medicare and self-pay.  [facility] for [place] [address], [address],  [place] Phone: [phone], Fax: [phone]. Hours of Operation:  8:30 am - 5:30 pm, M-F.  Also accepts Medicaid and self-pay.  [facility] [place] [address] [place] Phone: [phone]   [place], [place], [place] [phone], Ext. [phone] Mondays & Thursdays: 7-9 AM.  First 15 patients are seen on a first come, first serve basis.    [place] Providers:  Organization         Address  Phone   Notes  [facility] [address], [place] [phone] Also accepts self-pay patients.  [facility] [address], [place]  [phone]   [place], [address], [place] [phone]   [facility] [address] [place] [phone]   Lucianne Lei [address], [place]   [phone] Only accepts [place] Access [place] patients after they have their name applied to their card.   Self-Pay (no insurance) in [facility]:  Organization         Address  Phone   Notes  Sickle Cell Patients, [facility] [place] [phone]   [facility] [place] [phone]   [facility] [place]  [place], [place], [place] [phone]   [facility]/Dr. Osei-Bonsu  [address], [place] or [address], [place] [phone] Phone number for both [place] and [place] locations is the same.  Urgent Medical and [facility] [address], [place] [phone]   [facility] [address] [place] or [address] [phone]   [facility] [address], [place] [phone], phone; [phone], fax Sees patients 1st and 3rd Saturday of every month.  Must not  qualify for public or private insurance (i.e. Medicaid, Medicare, [place] Health Choice, Veterans' Benefits)  Household income should be no more than 200% of the poverty level The clinic cannot treat you if you are pregnant or think you are pregnant  Sexually transmitted diseases are not treated at the clinic.    Dental Care: Organization         Address  Phone  Notes  [facility] Department of [facility] [phone] Accepts children up to age 37 who are enrolled in [place] or [place]; pregnant women with a Medicaid card; and children who have applied for Medicaid or [place] Health Choice, but were declined, whose parents can pay a reduced fee at time of service.  [facility] Department of [facility]  [address],  [place] [phone] Accepts children up to age 76 who are enrolled in Medicaid or [place] Health Choice; pregnant women with a Medicaid card; and children who have applied for Medicaid or  Health Choice, but were declined, whose parents can pay a reduced fee at time of service.  [place] [facility]  [place] [phone] Patients are seen by appointment only. Walk-ins are not accepted. [place] will see patients 62 years of age and older. Monday - Tuesday (8am-5pm) Most Wednesdays (8:30-5pm) $30 per visit, cash only  [facility] Adult [facility] Access PROGRAM  [address], [facility] [phone] Patients are seen by appointment only. Walk-ins are not accepted. [place] will see patients 15 years of age and older. One Wednesday Evening (Monthly: Volunteer Based).  $30 per visit, cash only  [place]  [phone] for adults; Children under age 58, call Graduate Pediatric Dentistry at [phone]. Children aged 16-14, please call [phone] to request a pediatric application.  Dental services are provided  in all areas of dental care including fillings, crowns and bridges, complete and partial dentures, implants, gum treatment, root canals, and extractions. Preventive care is also provided. Treatment is provided to both adults and children. Patients are selected via a lottery and there is often a waiting list.   [facility] [address] [place]  [phone] [email or web address]   [facility] [address] [place], [place] [phone], Ext. [phone] Second and Fourth Thursday of each month, opens at 6:30 AM; Clinic ends at 9 AM.  Patients are seen on a first-come first-served basis, and a limited number are seen during each clinic.   [facility]  [address] Hillard Danker [place], [place] [phone]   Eligibility Requirements You must have lived in [place], [place], or [place] counties for at least the last three months.   You cannot be eligible for state or federal sponsored [employer], including [employer], [place], or [employer].   You generally cannot be eligible for healthcare insurance through your employer.    How to apply: Eligibility screenings are held every Tuesday and Wednesday afternoon from 1:00 pm until 4:00 pm. You do not need an appointment for the interview!  [facility] [address] [employer], [place]   [place]  [place] Department  [place]  [phone]    Behavioral Health Resources in the Community: Intensive Outpatient Programs Organization         Address  Phone  Notes  [place]. [address], [place], [place] [phone]   [facility] Outpatient [address] [place], [place]   ADS: [facility] [address], [place], [place]   [place] [address],  [place], [place] or [phone]   Substance Abuse Resources Organization         Address  Phone  Notes  Alcohol and Drug Services  [phone]   [place]  [phone]   [facility] [place]   Chinita Pester  [phone]   Residential & Outpatient Substance Abuse Program  [phone]   Psychological Services Organization         Address  Phone  Notes  [place]  [place]  [phone]  [phone]   [facility] Mental Health [address], [place] [phone] or [phone]    Mobile Crisis Teams Organization         Address  Phone  Notes  [facility], Mobile Crisis Care Unit  [phone]   [facility]  [address] [place], [place] [phone]   Doristine Locks [address] [place] [phone]    Self-Help/Support Groups Organization         Address  Phone             Notes  [facility] [place] - variety of support groups  336- I7437963 Call for more information  Narcotics Anonymous (NA), [facility] [address], [employer] [place]  2 meetings at this location   Statistician         Address  Phone  Notes  [facility] [address],    [place]  [phone]   [facility]  [address] [place] [address], [place], [place] [phone]   [facility] [address] [place], [place] [phone] Admissions: 8am-3pm M-F  [facility] [address]    [place], [place] [phone]   [facility] [address] [place], [place], [place] [phone]   [facility] [address]  [place], [place] [phone]   [facility] - Intensive Outpatient [address] Laurell [address], [place], [place] [phone]   [facility] ([facility] [address] [place].,  [place], [place] [phone] or  [phone]   [facility] ([facility]) [address] [place], [place] [phone] Accepts Medicaid  [facility] [address]  [place] [phone] Substance Abuse/Addiction Treatment   [facility] Organization         Address  Phone  Notes  [facility]  [phone]   Angie Fava, PhD [address] Ervin Knack [place], [place]   [phone] or [phone]   [facility] Behavioral   [address] [place], [place] [phone]   [facility] [address] [place], [place] [phone] Insurance/Medicaid/sponsorship through [facility] and Families [address]                                    [place], [place] [phone] Therapy/tele-psych/case  [facility] [address], [place] [phone]    Dr. Lolly Mustache  [phone]   [facility] [place]  [employer] [facility] Dept. 1) [address] [place] 2) [address] [place] 3)  [address], [place] [phone]  [phone]   [facility] Child Abuse Hotline (276)639-7145 or [phone] (After Hours)

## 2015-03-03 ENCOUNTER — Encounter (HOSPITAL_COMMUNITY): Payer: Self-pay | Admitting: Emergency Medicine

## 2015-03-03 ENCOUNTER — Emergency Department (HOSPITAL_COMMUNITY): Payer: Self-pay

## 2015-03-03 ENCOUNTER — Emergency Department (HOSPITAL_COMMUNITY)
Admission: EM | Admit: 2015-03-03 | Discharge: 2015-03-03 | Disposition: A | Discharge status: Home / Self Care | Payer: Self-pay | Attending: Emergency Medicine | Admitting: Emergency Medicine

## 2015-03-03 DIAGNOSIS — Y998 Other external cause status: Secondary | ICD-10-CM | POA: Insufficient documentation

## 2015-03-03 DIAGNOSIS — S199XXA Unspecified injury of neck, initial encounter: Secondary | ICD-10-CM | POA: Insufficient documentation

## 2015-03-03 DIAGNOSIS — Y92410 Unspecified street and highway as the place of occurrence of the external cause: Secondary | ICD-10-CM | POA: Insufficient documentation

## 2015-03-03 DIAGNOSIS — S0990XA Unspecified injury of head, initial encounter: Secondary | ICD-10-CM | POA: Insufficient documentation

## 2015-03-03 DIAGNOSIS — S4992XA Unspecified injury of left shoulder and upper arm, initial encounter: Secondary | ICD-10-CM | POA: Insufficient documentation

## 2015-03-03 DIAGNOSIS — Z9889 Other specified postprocedural states: Secondary | ICD-10-CM | POA: Insufficient documentation

## 2015-03-03 DIAGNOSIS — Y9389 Activity, other specified: Secondary | ICD-10-CM | POA: Insufficient documentation

## 2015-03-03 DIAGNOSIS — F17200 Nicotine dependence, unspecified, uncomplicated: Secondary | ICD-10-CM | POA: Insufficient documentation

## 2015-03-03 LAB — BASIC METABOLIC PANEL
ANION GAP: 9 (ref 5–15)
BUN: 11 mg/dL (ref 6–20)
CO2: 24 mmol/L (ref 22–32)
Calcium: 9.5 mg/dL (ref 8.9–10.3)
Chloride: 103 mmol/L (ref 101–111)
Creatinine, Ser: 0.9 mg/dL (ref 0.61–1.24)
Glucose, Bld: 99 mg/dL (ref 65–99)
POTASSIUM: 4.1 mmol/L (ref 3.5–5.1)
SODIUM: 136 mmol/L (ref 135–145)

## 2015-03-03 LAB — CBC WITH DIFFERENTIAL/PLATELET
BASOS ABS: 0 10*3/uL (ref 0.0–0.1)
BASOS PCT: 0 %
EOS ABS: 0.1 10*3/uL (ref 0.0–0.7)
EOS PCT: 1 %
HCT: 42.7 % (ref 39.0–52.0)
Hemoglobin: 15.2 g/dL (ref 13.0–17.0)
Lymphocytes Relative: 17 %
Lymphs Abs: 2 10*3/uL (ref 0.7–4.0)
MCH: 32.4 pg (ref 26.0–34.0)
MCHC: 35.6 g/dL (ref 30.0–36.0)
MCV: 91 fL (ref 78.0–100.0)
MONO ABS: 0.7 10*3/uL (ref 0.1–1.0)
MONOS PCT: 6 %
NEUTROS ABS: 8.9 10*3/uL — AB (ref 1.7–7.7)
Neutrophils Relative %: 76 %
PLATELETS: 264 10*3/uL (ref 150–400)
RBC: 4.69 MIL/uL (ref 4.22–5.81)
RDW: 13.1 % (ref 11.5–15.5)
WBC: 11.7 10*3/uL — AB (ref 4.0–10.5)

## 2015-03-03 MED ORDER — IBUPROFEN 800 MG PO TABS
800.0000 mg | ORAL_TABLET | Freq: Three times a day (TID) | ORAL | Status: DC
Start: 2015-03-03 — End: 2016-12-07

## 2015-03-03 MED ORDER — METHOCARBAMOL 500 MG PO TABS
500.0000 mg | ORAL_TABLET | Freq: Once | ORAL | Status: AC
  Administered 2015-03-03: 500 mg via ORAL
  Filled 2015-03-03: qty 1

## 2015-03-03 MED ORDER — HYDROMORPHONE HCL 1 MG/ML IJ SOLN
1.0000 mg | Freq: Once | INTRAMUSCULAR | Status: AC
  Administered 2015-03-03: 1 mg via INTRAVENOUS
  Filled 2015-03-03: qty 1

## 2015-03-03 MED ORDER — KETOROLAC TROMETHAMINE 30 MG/ML IJ SOLN
30.0000 mg | Freq: Once | INTRAMUSCULAR | Status: AC
  Administered 2015-03-03: 30 mg via INTRAVENOUS
  Filled 2015-03-03: qty 1

## 2015-03-03 MED ORDER — METHOCARBAMOL 500 MG PO TABS: 500.0000 mg | ORAL_TABLET | Freq: Two times a day (BID) | ORAL | Status: DC
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
## 2015-03-03 NOTE — ED Notes (Signed)
Pt returned from CT °

## 2016-11-29 ENCOUNTER — Observation Stay (HOSPITAL_COMMUNITY)
Admission: EM | Admit: 2016-11-29 | Discharge: 2016-12-01 | Disposition: A | Discharge status: Home / Self Care | Payer: BLUE CROSS/BLUE SHIELD | Attending: Orthopedic Surgery | Admitting: Orthopedic Surgery

## 2016-11-29 ENCOUNTER — Encounter (HOSPITAL_COMMUNITY): Payer: Self-pay

## 2016-11-29 ENCOUNTER — Emergency Department (HOSPITAL_COMMUNITY): Payer: BLUE CROSS/BLUE SHIELD

## 2016-11-29 ENCOUNTER — Other Ambulatory Visit (HOSPITAL_COMMUNITY): Payer: Self-pay | Admitting: Radiology

## 2016-11-29 DIAGNOSIS — S82892C Other fracture of left lower leg, initial encounter for open fracture type IIIA, IIIB, or IIIC: Secondary | ICD-10-CM | POA: Diagnosis present

## 2016-11-29 DIAGNOSIS — S91012A Laceration without foreign body, left ankle, initial encounter: Secondary | ICD-10-CM | POA: Insufficient documentation

## 2016-11-29 DIAGNOSIS — S82842C Displaced bimalleolar fracture of left lower leg, initial encounter for open fracture type IIIA, IIIB, or IIIC: Secondary | ICD-10-CM | POA: Diagnosis not present

## 2016-11-29 DIAGNOSIS — Y92488 Other paved roadways as the place of occurrence of the external cause: Secondary | ICD-10-CM | POA: Insufficient documentation

## 2016-11-29 DIAGNOSIS — T849XXA Unspecified complication of internal orthopedic prosthetic device, implant and graft, initial encounter: Secondary | ICD-10-CM | POA: Diagnosis present

## 2016-11-29 DIAGNOSIS — F17200 Nicotine dependence, unspecified, uncomplicated: Secondary | ICD-10-CM | POA: Diagnosis not present

## 2016-11-29 LAB — I-STAT CG4 LACTIC ACID, ED: LACTIC ACID, VENOUS: 2.56 mmol/L — AB (ref 0.5–1.9)

## 2016-11-29 LAB — I-STAT CHEM 8, ED
BUN: 18 mg/dL (ref 6–20)
CHLORIDE: 106 mmol/L (ref 101–111)
Calcium, Ion: 0.99 mmol/L — ABNORMAL LOW (ref 1.15–1.40)
Creatinine, Ser: 1 mg/dL (ref 0.61–1.24)
Glucose, Bld: 108 mg/dL — ABNORMAL HIGH (ref 65–99)
HCT: 42 % (ref 39.0–52.0)
Hemoglobin: 14.3 g/dL (ref 13.0–17.0)
POTASSIUM: 4 mmol/L (ref 3.5–5.1)
SODIUM: 139 mmol/L (ref 135–145)
TCO2: 21 mmol/L — ABNORMAL LOW (ref 22–32)

## 2016-11-29 MED ORDER — IOPAMIDOL (ISOVUE-300) INJECTION 61%
INTRAVENOUS | Status: AC
  Administered 2016-11-30: 100 mL
  Filled 2016-11-29: qty 100

## 2016-11-29 MED ORDER — HYDROMORPHONE HCL 1 MG/ML IJ SOLN
INTRAMUSCULAR | Status: AC
  Filled 2016-11-29: qty 1

## 2016-11-29 MED ORDER — TETANUS-DIPHTH-ACELL PERTUSSIS 5-2.5-18.5 LF-MCG/0.5 IM SUSP
0.5000 mL | Freq: Once | INTRAMUSCULAR | Status: DC
  Filled 2016-11-29: qty 0.5

## 2016-11-29 MED ORDER — SODIUM CHLORIDE 0.9 % IV SOLN
INTRAVENOUS | Status: DC
  Administered 2016-11-30 (×2): via INTRAVENOUS

## 2016-11-29 MED ORDER — KETAMINE HCL-SODIUM CHLORIDE 100-0.9 MG/10ML-% IV SOSY: 50.0000 mg | PREFILLED_SYRINGE | Freq: Once | INTRAVENOUS | Status: DC

## 2016-11-29 MED ORDER — CEFAZOLIN SODIUM-DEXTROSE 2-4 GM/100ML-% IV SOLN
2.0000 g | Freq: Once | INTRAVENOUS | Status: DC
  Filled 2016-11-29: qty 100

## 2016-11-29 MED ORDER — SODIUM CHLORIDE 0.9 % IV BOLUS (SEPSIS)
1000.0000 mL | Freq: Once | INTRAVENOUS | Status: AC
  Administered 2016-11-29: 1000 mL via INTRAVENOUS

## 2016-11-29 MED ORDER — CEFAZOLIN SODIUM-DEXTROSE 2-4 GM/100ML-% IV SOLN
2.0000 g | Freq: Once | INTRAVENOUS | Status: AC
  Administered 2016-11-29: 2 g via INTRAVENOUS

## 2016-11-29 MED ORDER — HYDROMORPHONE HCL 1 MG/ML IJ SOLN
INTRAMUSCULAR | Status: AC | PRN
  Administered 2016-11-29: 1 mg via INTRAVENOUS
  Administered 2016-11-29: 23:00:00 via INTRAVENOUS
  Administered 2016-11-29: 1 mg via INTRAVENOUS

## 2016-11-29 MED ORDER — KETAMINE HCL-SODIUM CHLORIDE 100-0.9 MG/10ML-% IV SOSY
PREFILLED_SYRINGE | INTRAVENOUS | Status: AC
  Administered 2016-11-29: 100 mg
  Filled 2016-11-29: qty 10
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
## 2016-11-30 ENCOUNTER — Emergency Department (HOSPITAL_COMMUNITY): Payer: BLUE CROSS/BLUE SHIELD

## 2016-11-30 ENCOUNTER — Emergency Department (HOSPITAL_COMMUNITY): Payer: BLUE CROSS/BLUE SHIELD | Admitting: Anesthesiology

## 2016-11-30 ENCOUNTER — Encounter (HOSPITAL_COMMUNITY)
Admission: EM | Disposition: A | Discharge status: Home / Self Care | Payer: Self-pay | Source: Home / Self Care | Attending: Emergency Medicine

## 2016-11-30 ENCOUNTER — Inpatient Hospital Stay (HOSPITAL_COMMUNITY): Payer: BLUE CROSS/BLUE SHIELD

## 2016-11-30 DIAGNOSIS — S82842C Displaced bimalleolar fracture of left lower leg, initial encounter for open fracture type IIIA, IIIB, or IIIC: Secondary | ICD-10-CM | POA: Diagnosis present

## 2016-11-30 DIAGNOSIS — T849XXA Unspecified complication of internal orthopedic prosthetic device, implant and graft, initial encounter: Secondary | ICD-10-CM | POA: Diagnosis present

## 2016-11-30 HISTORY — PX: I & D EXTREMITY: SHX5045

## 2016-11-30 HISTORY — PX: EXTERNAL FIXATION LEG: SHX1549

## 2016-11-30 LAB — URINALYSIS, ROUTINE W REFLEX MICROSCOPIC
BILIRUBIN URINE: NEGATIVE
Glucose, UA: NEGATIVE mg/dL
HGB URINE DIPSTICK: NEGATIVE
KETONES UR: NEGATIVE mg/dL
Leukocytes, UA: NEGATIVE
NITRITE: NEGATIVE
Protein, ur: NEGATIVE mg/dL
Specific Gravity, Urine: 1.031 — ABNORMAL HIGH (ref 1.005–1.030)
pH: 5 (ref 5.0–8.0)

## 2016-11-30 LAB — COMPREHENSIVE METABOLIC PANEL
ALT: 41 U/L (ref 17–63)
AST: 58 U/L — ABNORMAL HIGH (ref 15–41)
Albumin: 4.5 g/dL (ref 3.5–5.0)
Alkaline Phosphatase: 95 U/L (ref 38–126)
Anion gap: 12 (ref 5–15)
BUN: 16 mg/dL (ref 6–20)
CHLORIDE: 105 mmol/L (ref 101–111)
CO2: 22 mmol/L (ref 22–32)
CREATININE: 1.02 mg/dL (ref 0.61–1.24)
Calcium: 9.4 mg/dL (ref 8.9–10.3)
GFR calc non Af Amer: >60 mL/min (ref >60)
Glucose, Bld: 107 mg/dL — ABNORMAL HIGH (ref 65–99)
Potassium: 3.9 mmol/L (ref 3.5–5.1)
SODIUM: 139 mmol/L (ref 135–145)
Total Bilirubin: 0.6 mg/dL (ref 0.3–1.2)
Total Protein: 7.6 g/dL (ref 6.5–8.1)

## 2016-11-30 LAB — SAMPLE TO BLOOD BANK

## 2016-11-30 LAB — CBC
HEMATOCRIT: 41.8 % (ref 39.0–52.0)
Hemoglobin: 14.8 g/dL (ref 13.0–17.0)
MCH: 31.9 pg (ref 26.0–34.0)
MCHC: 35.4 g/dL (ref 30.0–36.0)
MCV: 90.1 fL (ref 78.0–100.0)
PLATELETS: 292 10*3/uL (ref 150–400)
RBC: 4.64 MIL/uL (ref 4.22–5.81)
RDW: 13 % (ref 11.5–15.5)
WBC: 13.9 10*3/uL — AB (ref 4.0–10.5)

## 2016-11-30 LAB — PROTIME-INR
INR: 0.93
PROTHROMBIN TIME: 12.4 s (ref 11.4–15.2)

## 2016-11-30 LAB — LACTIC ACID, PLASMA: Lactic Acid, Venous: 1.1 mmol/L (ref 0.5–1.9)

## 2016-11-30 LAB — CDS SEROLOGY

## 2016-11-30 LAB — ETHANOL: ALCOHOL ETHYL (B): 154 mg/dL — AB (ref <5)

## 2016-11-30 SURGERY — IRRIGATION AND DEBRIDEMENT EXTREMITY
Anesthesia: General | Laterality: Left

## 2016-11-30 MED ORDER — SENNA 8.6 MG PO TABS
1.0000 | ORAL_TABLET | Freq: Two times a day (BID) | ORAL | Status: DC
  Administered 2016-11-30 – 2016-12-01 (×2): 8.6 mg via ORAL
  Filled 2016-11-30 (×3): qty 1

## 2016-11-30 MED ORDER — HYDROMORPHONE HCL 1 MG/ML IJ SOLN
0.2500 mg | INTRAMUSCULAR | Status: DC | PRN
  Administered 2016-11-30 (×4): 0.5 mg via INTRAVENOUS

## 2016-11-30 MED ORDER — SODIUM CHLORIDE 0.9 % IV BOLUS (SEPSIS)
1000.0000 mL | Freq: Once | INTRAVENOUS | Status: AC
  Administered 2016-11-30: 1000 mL via INTRAVENOUS

## 2016-11-30 MED ORDER — FLEET ENEMA 7-19 GM/118ML RE ENEM: 1.0000 | ENEMA | Freq: Once | RECTAL | Status: DC | PRN

## 2016-11-30 MED ORDER — CEFAZOLIN SODIUM-DEXTROSE 2-4 GM/100ML-% IV SOLN: 2.0000 g | INTRAVENOUS | Status: DC

## 2016-11-30 MED ORDER — HYDROMORPHONE HCL 1 MG/ML IJ SOLN
INTRAMUSCULAR | Status: AC
  Filled 2016-11-30: qty 1

## 2016-11-30 MED ORDER — HYDROMORPHONE HCL 1 MG/ML IJ SOLN
1.0000 mg | Freq: Once | INTRAMUSCULAR | Status: AC
  Administered 2016-11-30: 1 mg via INTRAVENOUS

## 2016-11-30 MED ORDER — LACTATED RINGERS IV SOLN
INTRAVENOUS | Status: DC | PRN
  Administered 2016-11-30: 04:00:00 via INTRAVENOUS

## 2016-11-30 MED ORDER — ACETAMINOPHEN 650 MG RE SUPP: 650.0000 mg | Freq: Four times a day (QID) | RECTAL | Status: DC | PRN

## 2016-11-30 MED ORDER — ONDANSETRON HCL 4 MG PO TABS: 4.0000 mg | ORAL_TABLET | Freq: Four times a day (QID) | ORAL | Status: DC | PRN

## 2016-11-30 MED ORDER — ONDANSETRON HCL 4 MG/2ML IJ SOLN: 4.0000 mg | Freq: Four times a day (QID) | INTRAMUSCULAR | Status: DC | PRN

## 2016-11-30 MED ORDER — MAGNESIUM HYDROXIDE 400 MG/5ML PO SUSP: 30.0000 mL | Freq: Every day | ORAL | Status: DC | PRN

## 2016-11-30 MED ORDER — CEFAZOLIN SODIUM-DEXTROSE 1-4 GM/50ML-% IV SOLN
1.0000 g | Freq: Four times a day (QID) | INTRAVENOUS | Status: AC
  Administered 2016-11-30 (×3): 1 g via INTRAVENOUS
  Filled 2016-11-30 (×3): qty 50

## 2016-11-30 MED ORDER — KETOROLAC TROMETHAMINE 15 MG/ML IJ SOLN
15.0000 mg | Freq: Four times a day (QID) | INTRAMUSCULAR | Status: AC
  Administered 2016-11-30 (×4): 15 mg via INTRAVENOUS
  Filled 2016-11-30 (×4): qty 1

## 2016-11-30 MED ORDER — OXYCODONE HCL 5 MG PO TABS: 5.0000 mg | ORAL_TABLET | Freq: Once | ORAL | Status: DC | PRN

## 2016-11-30 MED ORDER — FENTANYL CITRATE (PF) 250 MCG/5ML IJ SOLN
INTRAMUSCULAR | Status: AC
  Filled 2016-11-30: qty 5

## 2016-11-30 MED ORDER — MIDAZOLAM HCL 2 MG/2ML IJ SOLN
INTRAMUSCULAR | Status: AC
  Filled 2016-11-30: qty 2

## 2016-11-30 MED ORDER — FENTANYL CITRATE (PF) 250 MCG/5ML IJ SOLN
INTRAMUSCULAR | Status: DC | PRN
  Administered 2016-11-30 (×2): 100 ug via INTRAVENOUS
  Administered 2016-11-30: 50 ug via INTRAVENOUS

## 2016-11-30 MED ORDER — HYDROMORPHONE HCL 1 MG/ML IJ SOLN
1.0000 mg | INTRAMUSCULAR | Status: DC | PRN
  Administered 2016-11-30 – 2016-12-01 (×9): 1 mg via INTRAVENOUS
  Filled 2016-11-30 (×11): qty 1

## 2016-11-30 MED ORDER — LIDOCAINE HCL (CARDIAC) 20 MG/ML IV SOLN
INTRAVENOUS | Status: DC | PRN
  Administered 2016-11-30: 80 mg via INTRATRACHEAL

## 2016-11-30 MED ORDER — DIAZEPAM 5 MG PO TABS
5.0000 mg | ORAL_TABLET | Freq: Three times a day (TID) | ORAL | Status: DC | PRN
  Administered 2016-11-30 – 2016-12-01 (×3): 5 mg via ORAL
  Filled 2016-11-30 (×3): qty 1

## 2016-11-30 MED ORDER — SUCCINYLCHOLINE 20MG/ML (10ML) SYRINGE FOR MEDFUSION PUMP - OPTIME
INTRAMUSCULAR | Status: DC | PRN
  Administered 2016-11-30: 70 mg via INTRAVENOUS

## 2016-11-30 MED ORDER — LACTATED RINGERS IV SOLN
INTRAVENOUS | Status: DC
  Administered 2016-11-30 (×3): via INTRAVENOUS

## 2016-11-30 MED ORDER — POVIDONE-IODINE 10 % EX SWAB
2.0000 | Freq: Once | CUTANEOUS | Status: DC
Start: 2016-11-30 — End: 2016-12-01

## 2016-11-30 MED ORDER — DEXMEDETOMIDINE HCL 200 MCG/2ML IV SOLN
20.0000 ug | Freq: Once | INTRAVENOUS | Status: AC
Start: 2016-11-30 — End: 2016-11-30
  Administered 2016-11-30: 20 ug via INTRAVENOUS
  Filled 2016-11-30: qty 0.2

## 2016-11-30 MED ORDER — LIDOCAINE 2% (20 MG/ML) 5 ML SYRINGE
INTRAMUSCULAR | Status: AC
Start: 2016-11-30 — End: ?
  Filled 2016-11-30: qty 5

## 2016-11-30 MED ORDER — LACTATED RINGERS IV SOLN
INTRAVENOUS | Status: DC
  Administered 2016-11-30: 07:00:00 via INTRAVENOUS

## 2016-11-30 MED ORDER — OXYCODONE HCL 5 MG/5ML PO SOLN: 5.0000 mg | Freq: Once | ORAL | Status: DC | PRN

## 2016-11-30 MED ORDER — CHLORHEXIDINE GLUCONATE 4 % EX LIQD
60.0000 mL | Freq: Once | CUTANEOUS | Status: DC
Start: 2016-11-30 — End: 2016-12-01

## 2016-11-30 MED ORDER — OXYCODONE HCL 5 MG PO TABS
5.0000 mg | ORAL_TABLET | ORAL | Status: DC | PRN
  Administered 2016-11-30 – 2016-12-01 (×6): 10 mg via ORAL
  Filled 2016-11-30 (×6): qty 2

## 2016-11-30 MED ORDER — SODIUM CHLORIDE 0.9 % IR SOLN
Status: DC | PRN
  Administered 2016-11-30: 3000 mL

## 2016-11-30 MED ORDER — PROPOFOL 10 MG/ML IV BOLUS
INTRAVENOUS | Status: DC | PRN
  Administered 2016-11-30: 300 mg via INTRAVENOUS

## 2016-11-30 MED ORDER — ACETAMINOPHEN 325 MG PO TABS
650.0000 mg | ORAL_TABLET | Freq: Four times a day (QID) | ORAL | Status: DC | PRN
  Administered 2016-11-30: 650 mg via ORAL
  Filled 2016-11-30: qty 2

## 2016-11-30 MED ORDER — HYDROMORPHONE HCL 1 MG/ML IJ SOLN
INTRAMUSCULAR | Status: AC
Start: 2016-11-30 — End: 2016-11-30
  Filled 2016-11-30: qty 2

## 2016-11-30 MED ORDER — CEFAZOLIN SODIUM-DEXTROSE 2-3 GM-% IV SOLR
INTRAVENOUS | Status: DC | PRN
  Administered 2016-11-30: 2 g via INTRAVENOUS

## 2016-11-30 MED ORDER — NICOTINE 7 MG/24HR TD PT24
7.0000 mg | MEDICATED_PATCH | Freq: Every day | TRANSDERMAL | Status: DC
  Administered 2016-11-30: 7 mg via TRANSDERMAL
  Filled 2016-11-30 (×2): qty 1

## 2016-11-30 MED ORDER — DOCUSATE SODIUM 100 MG PO CAPS
100.0000 mg | ORAL_CAPSULE | Freq: Two times a day (BID) | ORAL | Status: DC
  Administered 2016-11-30 – 2016-12-01 (×3): 100 mg via ORAL
  Filled 2016-11-30 (×3): qty 1

## 2016-11-30 MED ORDER — PROPOFOL 10 MG/ML IV BOLUS
INTRAVENOUS | Status: AC
  Filled 2016-11-30: qty 20

## 2016-11-30 MED ORDER — SORBITOL 70 % SOLN: 30.0000 mL | Freq: Every day | Status: DC | PRN

## 2016-11-30 SURGICAL SUPPLY — 73 items
BANDAGE ACE 4X5 VEL STRL LF (GAUZE/BANDAGES/DRESSINGS) ×3 IMPLANT
BANDAGE ESMARK 6X9 LF (GAUZE/BANDAGES/DRESSINGS) ×1 IMPLANT
BAR EXFX 350X11 NS LF (EXFIX) ×2
BAR GLASS FIBER EXFX 11X350 (EXFIX) ×4 IMPLANT
BIT DRILL CANN MED FLUTE 4.0 (BIT) IMPLANT
BLADE SURG 10 STRL SS (BLADE) ×1 IMPLANT
BNDG CMPR 9X6 STRL LF SNTH (GAUZE/BANDAGES/DRESSINGS) ×1
BNDG COHESIVE 4X5 TAN STRL (GAUZE/BANDAGES/DRESSINGS) ×3 IMPLANT
BNDG COHESIVE 6X5 TAN STRL LF (GAUZE/BANDAGES/DRESSINGS) ×3 IMPLANT
BNDG CONFORM 3 STRL LF (GAUZE/BANDAGES/DRESSINGS) ×3 IMPLANT
BNDG ESMARK 6X9 LF (GAUZE/BANDAGES/DRESSINGS) ×3
BNDG GAUZE ELAST 4 BULKY (GAUZE/BANDAGES/DRESSINGS) ×2 IMPLANT
CANISTER SUCT 3000ML PPV (MISCELLANEOUS) ×3 IMPLANT
CHLORAPREP W/TINT 26ML (MISCELLANEOUS) ×1 IMPLANT
CLAMP BLUE BAR TO PIN (EXFIX) ×4 IMPLANT
COVER MAYO STAND STRL (DRAPES) ×2 IMPLANT
COVER SURGICAL LIGHT HANDLE (MISCELLANEOUS) ×3 IMPLANT
CUFF TOURNIQUET SINGLE 34IN LL (TOURNIQUET CUFF) ×3 IMPLANT
CUFF TOURNIQUET SINGLE 44IN (TOURNIQUET CUFF) IMPLANT
DRAIN CHANNEL 10M FLAT 3/4 FLT (DRAIN) IMPLANT
DRAIN PENROSE 1/2X12 LTX STRL (WOUND CARE) IMPLANT
DRAPE C-ARM 42X72 X-RAY (DRAPES) ×1 IMPLANT
DRAPE OEC MINIVIEW 54X84 (DRAPES) ×4 IMPLANT
DRAPE U-SHAPE 47X51 STRL (DRAPES) ×3 IMPLANT
DRILL CANN 4.0MM (BIT) ×3
DRSG ADAPTIC 3X8 NADH LF (GAUZE/BANDAGES/DRESSINGS) IMPLANT
DRSG MEPITEL 4X7.2 (GAUZE/BANDAGES/DRESSINGS) ×3 IMPLANT
DRSG PAD ABDOMINAL 8X10 ST (GAUZE/BANDAGES/DRESSINGS) ×4 IMPLANT
ELECT REM PT RETURN 9FT ADLT (ELECTROSURGICAL) ×3
ELECTRODE REM PT RTRN 9FT ADLT (ELECTROSURGICAL) ×1 IMPLANT
EVACUATOR SILICONE 100CC (DRAIN) IMPLANT
GAUZE SPONGE 4X4 12PLY STRL (GAUZE/BANDAGES/DRESSINGS) IMPLANT
GAUZE SPONGE 4X4 12PLY STRL LF (GAUZE/BANDAGES/DRESSINGS) ×2 IMPLANT
GLOVE BIO SURGEON STRL SZ8 (GLOVE) ×3 IMPLANT
GLOVE BIOGEL PI IND STRL 8 (GLOVE) ×2 IMPLANT
GLOVE BIOGEL PI INDICATOR 8 (GLOVE) ×2
GLOVE ECLIPSE 8.0 STRL XLNG CF (GLOVE) ×3 IMPLANT
GOWN STRL REUS W/ TWL LRG LVL3 (GOWN DISPOSABLE) ×1 IMPLANT
GOWN STRL REUS W/ TWL XL LVL3 (GOWN DISPOSABLE) ×2 IMPLANT
GOWN STRL REUS W/TWL LRG LVL3 (GOWN DISPOSABLE) ×3
GOWN STRL REUS W/TWL XL LVL3 (GOWN DISPOSABLE) ×6
KIT BASIN OR (CUSTOM PROCEDURE TRAY) ×3 IMPLANT
KIT ROOM TURNOVER OR (KITS) ×3 IMPLANT
NEEDLE 22X1 1/2 (OR ONLY) (NEEDLE) IMPLANT
NS IRRIG 1000ML POUR BTL (IV SOLUTION) ×3 IMPLANT
PACK ORTHO EXTREMITY (CUSTOM PROCEDURE TRAY) ×3 IMPLANT
PAD ABD 8X10 STRL (GAUZE/BANDAGES/DRESSINGS) ×2 IMPLANT
PAD ARMBOARD 7.5X6 YLW CONV (MISCELLANEOUS) ×6 IMPLANT
PAD CAST 4YDX4 CTTN HI CHSV (CAST SUPPLIES) ×1 IMPLANT
PADDING CAST COTTON 4X4 STRL (CAST SUPPLIES) ×3
PIN CLAMP 2BAR 75MM BLUE (EXFIX) ×2 IMPLANT
PIN HALF YELLOW 5X160X35 (EXFIX) ×4 IMPLANT
PIN TRANSFIXING 5.0 (EXFIX) ×2 IMPLANT
SET CYSTO W/LG BORE CLAMP LF (SET/KITS/TRAYS/PACK) ×3 IMPLANT
SOAP 2 % CHG 4 OZ (WOUND CARE) ×1 IMPLANT
SPONGE LAP 4X18 X RAY DECT (DISPOSABLE) ×3 IMPLANT
STAPLER VISISTAT 35W (STAPLE) IMPLANT
SUCTION FRAZIER HANDLE 10FR (MISCELLANEOUS) ×2
SUCTION TUBE FRAZIER 10FR DISP (MISCELLANEOUS) ×1 IMPLANT
SUT ETHILON 2 0 FS 18 (SUTURE) ×4 IMPLANT
SUT PROLENE 3 0 PS 2 (SUTURE) ×1 IMPLANT
SUT VIC AB 2-0 CT1 27 (SUTURE)
SUT VIC AB 2-0 CT1 TAPERPNT 27 (SUTURE) IMPLANT
SUT VIC AB 3-0 PS2 18 (SUTURE)
SUT VIC AB 3-0 PS2 18XBRD (SUTURE) ×1 IMPLANT
SYR CONTROL 10ML LL (SYRINGE) IMPLANT
TOWEL OR 17X24 6PK STRL BLUE (TOWEL DISPOSABLE) ×3 IMPLANT
TOWEL OR 17X26 10 PK STRL BLUE (TOWEL DISPOSABLE) ×3 IMPLANT
TUBE CONNECTING 12'X1/4 (SUCTIONS) ×1
TUBE CONNECTING 12X1/4 (SUCTIONS) ×2 IMPLANT
TUBING CYSTO DISP (UROLOGICAL SUPPLIES) ×3 IMPLANT
WATER STERILE IRR 1000ML POUR (IV SOLUTION) ×3 IMPLANT
YANKAUER SUCT BULB TIP NO VENT (SUCTIONS) ×3 IMPLANT
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
## 2016-11-30 NOTE — ED Notes (Signed)
Family at bedside. 

## 2016-11-30 NOTE — ED Notes (Signed)
Tresa EndoKelly (wife)- [phone] Willeen CassBennett (father)- [phone]

## 2016-11-30 NOTE — Transfer of Care (Signed)
Immediate Anesthesia Transfer of Care Note  Patient: Jeffrey Braun  Procedure(s) Performed: Procedure(s): IRRIGATION AND DEBRIDEMENT LEFT ANKLE (Left) APPLICATION EXTERNAL FIXATION LEFT  LEG (Left)  Patient Location: PACU  Anesthesia Type:General  Level of Consciousness: awake, sedated and patient cooperative  Airway & Oxygen Therapy: Patient connected to nasal cannula oxygen  Post-op Assessment: Report given to RN and Post -op Vital signs reviewed and stable  Post vital signs: Reviewed and stable  Last Vitals:  Vitals:   11/30/16 0245 11/30/16 0300  BP: (!) 150/96 (!) 165/92  Pulse: (!) 106 (!) 110  Resp: 13   Temp:    SpO2: 92% 98%    Last Pain:  Vitals:   11/30/16 0212  TempSrc:   PainSc: 7          Complications: No apparent anesthesia complications
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
## 2016-11-30 NOTE — Anesthesia Preprocedure Evaluation (Addendum)
Anesthesia Evaluation  Patient identified by MRN, date of birth, ID band Patient awake    Reviewed: Allergy & Precautions, H&P , NPO status , Patient's Chart, lab work & pertinent test results  Airway Mallampati: II   Neck ROM: Limited   Comment: Cervical collar on Dental  (+) Teeth Intact   Pulmonary Current Smoker,    breath sounds clear to auscultation       Cardiovascular negative cardio ROS   Rhythm:regular Rate:Normal     Neuro/Psych    GI/Hepatic   Endo/Other    Renal/GU      Musculoskeletal   Abdominal   Peds  Hematology   Anesthesia Other Findings   Reproductive/Obstetrics                            Anesthesia Physical Anesthesia Plan  ASA: II and emergent  Anesthesia Plan: General   Post-op Pain Management:    Induction: Intravenous, Rapid sequence and Cricoid pressure planned  PONV Risk Score and Plan:   Airway Management Planned: Oral ETT and Video Laryngoscope Planned  Additional Equipment:   Intra-op Plan:   Post-operative Plan: Extubation in OR  Informed Consent: I have reviewed the patients History and Physical, chart, labs and discussed the procedure including the risks, benefits and alternatives for the proposed anesthesia with the patient or authorized representative who has indicated his/her understanding and acceptance.     Plan Discussed with: CRNA, Anesthesiologist and Surgeon  Anesthesia Plan Comments:        Anesthesia Quick Evaluation

## 2016-11-30 NOTE — Progress Notes (Signed)
Patient to CT with monitor.  VS stable throughout.  Patient then transferred to 5N10.
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
## 2016-11-30 NOTE — Anesthesia Procedure Notes (Signed)
Procedure Name: Intubation Date/Time: 11/30/2016 4:06 AM Performed by: Molli HazardGORDON, Kenneshia Rehm M Pre-anesthesia Checklist: Patient identified, Emergency Drugs available, Suction available and Patient being monitored Patient Re-evaluated:Patient Re-evaluated prior to induction Oxygen Delivery Method: Circle system utilized Preoxygenation: Pre-oxygenation with 100% oxygen Induction Type: IV induction and Rapid sequence Laryngoscope Size: Glidescope Grade View: Grade I Tube type: Oral Tube size: 7.5 mm Number of attempts: 2 Airway Equipment and Method: Stylet Placement Confirmation: ETT inserted through vocal cords under direct vision,  positive ETCO2 and breath sounds checked- equal and bilateral Secured at: 25 cm Tube secured with: Tape Dental Injury: Teeth and Oropharynx as per pre-operative assessment  Comments: Cervical collar removed for intubation, with manual traction keeping neck stable. Collar replaced after intubation.
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
## 2016-12-01 ENCOUNTER — Encounter (HOSPITAL_COMMUNITY): Payer: Self-pay | Admitting: Orthopedic Surgery

## 2016-12-01 MED ORDER — ENOXAPARIN SODIUM 40 MG/0.4ML ~~LOC~~ SOLN: 40.0000 mg | SUBCUTANEOUS | 0 refills | Status: DC

## 2016-12-01 MED ORDER — OXYCODONE HCL 5 MG PO TABS: 5.0000 mg | ORAL_TABLET | ORAL | 0 refills | Status: DC | PRN

## 2016-12-01 MED ORDER — SENNA 8.6 MG PO TABS: 2.0000 | ORAL_TABLET | Freq: Two times a day (BID) | ORAL | 0 refills | Status: DC

## 2016-12-01 MED ORDER — DOCUSATE SODIUM 100 MG PO CAPS: 100.0000 mg | ORAL_CAPSULE | Freq: Two times a day (BID) | ORAL | 0 refills | Status: DC

## 2016-12-01 NOTE — Anesthesia Postprocedure Evaluation (Signed)
Anesthesia Post Note  Patient: Jeffrey Braun  Procedure(s) Performed: Procedure(s) (LRB): IRRIGATION AND DEBRIDEMENT LEFT ANKLE (Left) APPLICATION EXTERNAL FIXATION LEFT  LEG (Left)     Patient location during evaluation: PACU Anesthesia Type: General Level of consciousness: awake and alert Pain management: pain level controlled Vital Signs Assessment: post-procedure vital signs reviewed and stable Respiratory status: spontaneous breathing, nonlabored ventilation, respiratory function stable and patient connected to nasal cannula oxygen Cardiovascular status: blood pressure returned to baseline and stable Postop Assessment: no signs of nausea or vomiting Anesthetic complications: no    Last Vitals:  Vitals:   11/30/16 1234 12/01/16 0553  BP: (!) 146/91 125/66  Pulse: 98 67  Resp: 15 16  Temp: 37.1 C 36.6 C  SpO2: 100% 100%    Last Pain:  Vitals:   12/01/16 0630  TempSrc:   PainSc: 2                  Einar Nolasco S

## 2016-12-01 NOTE — Progress Notes (Signed)
Pt discharge instructions reviewed with pt. Pt verbalizes understanding. Pt belongings with pt. Pt is not in distress. Pt is waiting on his wife to arrive to drive him home.

## 2016-12-01 NOTE — Progress Notes (Signed)
Orthopedic Tech Progress Note Patient Details:  Jeffrey SensorMichael B Braun March 14, 1986 [id number]  Ortho Devices Type of Ortho Device: Crutches Ortho Device/Splint Interventions: Application   Jeffrey FordyceJennifer C Chaysen Braun 12/01/2016, 11:53 AM
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
## 2016-12-01 NOTE — Discharge Instructions (Signed)
Jeffrey ArthursJohn Hewitt, MD [facility]  Please read the following information regarding your care after surgery.  Medications  You only need a prescription for the narcotic pain medicine (ex. oxycodone, Percocet, Norco).  All of the other medicines listed below are available over the counter. X acetominophen (Tylenol) 650 mg every 4-6 hours as you need for minor pain X oxycodone as prescribed for moderate to severe pain X Aleve 2 pills twice a day for the first 3 days after surgery.  Narcotic pain medicine (ex. oxycodone, Percocet, Vicodin) will cause constipation.  To prevent this problem, take the following medicines while you are taking any pain medicine. X docusate sodium (Colace) 100 mg twice a day X senna (Senokot) 2 tablets twice a day  X To help prevent blood clots, inject lovenox for two weeks after surgery.  You should also get up every hour while you are awake to move around.    Weight Bearing X Do not bear any weight on the operated leg or foot.  Cast / Splint / Dressing X Keep your splint, cast or dressing clean and dry.  Dont put anything (coat hanger, pencil, etc) down inside of it.  If it gets damp, use a hair dryer on the cool setting to dry it.  If it gets soaked, call the office to schedule an appointment for a cast change.     After your dressing, cast or splint is removed; you may shower, but do not soak or scrub the wound.  Allow the water to run over it, and then gently pat it dry.  Swelling It is normal for you to have swelling where you had surgery.  To reduce swelling and pain, keep your toes above your nose for at least 3 days after surgery.  It may be necessary to keep your foot or leg elevated for several weeks.  If it hurts, it should be elevated.  Follow Up Call my office at [phone] when you are discharged from the hospital or surgery center to schedule an appointment to be seen two weeks after surgery.  Call my office at [phone] if you  develop a fever >101.5 F, nausea, vomiting, bleeding from the surgical site or severe pain.

## 2016-12-01 NOTE — Progress Notes (Addendum)
Subjective: 1 Day Post-Op Procedure(s) (LRB): IRRIGATION AND DEBRIDEMENT LEFT ANKLE (Left) APPLICATION EXTERNAL FIXATION LEFT  LEG (Left)  Patient reports pain as mild to moderate.  Tolerating POs well.  Admits to flatus.  Denies fever, chills, N/V, SOB, CP.  Reports that he would like to go home.  Objective:   VITALS:  Temp:  [97.8 F (36.6 C)-98.8 F (37.1 C)] 97.8 F (36.6 C) (08/30 0553) Pulse Rate:  [67-98] 67 (08/30 0553) Resp:  [15-16] 16 (08/30 0553) BP: (125-146)/(66-91) 125/66 (08/30 0553) SpO2:  [100 %] 100 % (08/30 0553)  General: WDWN patient in NAD. Psych:  Appropriate mood and affect. Neuro:  A&O x 3, Moving all extremities, sensation intact to light touch HEENT:  EOMs intact Chest:  Even non-labored respirations Skin:  Splint and Ex-Fix C/D/I, no rashes or lesions Extremities: warm/dry, mild edema, no erythema or echymosis.  No lymphadenopathy. Pulses: Popliteus 2+ MSK:  ROM: EHL/FHL intact, MMT: patient can perform quad set    LABS  Recent Labs  11/29/16 2321 11/29/16 2338  HGB 14.8 14.3  WBC 13.9*  --   PLT 292  --     Recent Labs  11/29/16 2321 11/29/16 2338  NA 139 139  K 3.9 4.0  CL 105 106  CO2 22  --   BUN 16 18  CREATININE 1.02 1.00  GLUCOSE 107* 108*    Recent Labs  11/29/16 2321  INR 0.93     Assessment/Plan: 1 Day Post-Op Procedure(s) (LRB): IRRIGATION AND DEBRIDEMENT LEFT ANKLE (Left) APPLICATION EXTERNAL FIXATION LEFT  LEG (Left)  NWB LLE D/C home today Plan for 1 week post-op visit with Dr. Victorino DikeHewitt. Lovenox for DVT prophylaxis After performing a query on the [place] controlled substance reporting system database I have provided the patient with a Rx for oxycodone. Scripts on chart  Alfredo MartinezJustin Mivaan Corbitt, [facility] Office:  [phone]

## 2016-12-02 ENCOUNTER — Encounter (HOSPITAL_COMMUNITY): Payer: Self-pay | Admitting: Emergency Medicine

## 2016-12-04 ENCOUNTER — Emergency Department (HOSPITAL_COMMUNITY)
Admission: EM | Admit: 2016-12-04 | Discharge: 2016-12-04 | Disposition: A | Discharge status: Home / Self Care | Payer: BLUE CROSS/BLUE SHIELD | Attending: Emergency Medicine | Admitting: Emergency Medicine

## 2016-12-04 ENCOUNTER — Encounter (HOSPITAL_COMMUNITY): Payer: Self-pay

## 2016-12-04 DIAGNOSIS — F17200 Nicotine dependence, unspecified, uncomplicated: Secondary | ICD-10-CM | POA: Diagnosis not present

## 2016-12-04 DIAGNOSIS — Z79899 Other long term (current) drug therapy: Secondary | ICD-10-CM | POA: Insufficient documentation

## 2016-12-04 DIAGNOSIS — Z760 Encounter for issue of repeat prescription: Secondary | ICD-10-CM | POA: Insufficient documentation

## 2016-12-04 MED ORDER — OXYCODONE HCL 5 MG PO TABS
5.0000 mg | ORAL_TABLET | ORAL | 0 refills | Status: DC | PRN
Start: 2016-12-04 — End: 2016-12-07
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
## 2016-12-04 NOTE — ED Notes (Signed)
Pt had LLE sx last week with Dr. Earlie CountsHewett. Pt presents with ExFix in place. Pt requesting more Oxycodone. Pt has been taking 2 tablets every TWO HOURS, instead of 2 every 4 hours.

## 2016-12-04 NOTE — ED Triage Notes (Signed)
Patient here requesting refill on pain meds. Post surgery and discharge on 8/30. Has taken percocet 30 tabs x 3 days. NAD

## 2016-12-07 ENCOUNTER — Other Ambulatory Visit (HOSPITAL_COMMUNITY): Payer: Self-pay | Admitting: Orthopedic Surgery

## 2016-12-07 ENCOUNTER — Ambulatory Visit (HOSPITAL_COMMUNITY)
Admission: RE | Admit: 2016-12-07 | Discharge: 2016-12-07 | Disposition: A | Discharge status: Home / Self Care | Payer: BLUE CROSS/BLUE SHIELD | Source: Ambulatory Visit | Attending: Cardiology | Admitting: Cardiology

## 2016-12-07 ENCOUNTER — Encounter (HOSPITAL_BASED_OUTPATIENT_CLINIC_OR_DEPARTMENT_OTHER): Payer: Self-pay | Admitting: *Deleted

## 2016-12-07 ENCOUNTER — Other Ambulatory Visit: Payer: Self-pay | Admitting: Orthopedic Surgery

## 2016-12-07 ENCOUNTER — Encounter (HOSPITAL_COMMUNITY): Payer: Self-pay

## 2016-12-07 DIAGNOSIS — M7989 Other specified soft tissue disorders: Secondary | ICD-10-CM | POA: Diagnosis present

## 2016-12-07 DIAGNOSIS — R609 Edema, unspecified: Secondary | ICD-10-CM

## 2016-12-07 DIAGNOSIS — M79605 Pain in left leg: Secondary | ICD-10-CM | POA: Diagnosis present

## 2016-12-07 NOTE — Progress Notes (Signed)
Today's left lower extremity venous duplex is negative for DVT. Preliminary results given to Lauren.

## 2016-12-08 ENCOUNTER — Ambulatory Visit (HOSPITAL_BASED_OUTPATIENT_CLINIC_OR_DEPARTMENT_OTHER): Payer: BLUE CROSS/BLUE SHIELD | Admitting: Certified Registered"

## 2016-12-08 ENCOUNTER — Encounter (HOSPITAL_BASED_OUTPATIENT_CLINIC_OR_DEPARTMENT_OTHER)
Admission: RE | Disposition: A | Discharge status: Home / Self Care | Payer: Self-pay | Source: Ambulatory Visit | Attending: Orthopedic Surgery

## 2016-12-08 ENCOUNTER — Ambulatory Visit (HOSPITAL_BASED_OUTPATIENT_CLINIC_OR_DEPARTMENT_OTHER)
Admission: RE | Admit: 2016-12-08 | Discharge: 2016-12-08 | Disposition: A | Discharge status: Home / Self Care | Payer: BLUE CROSS/BLUE SHIELD | Source: Ambulatory Visit | Attending: Orthopedic Surgery | Admitting: Orthopedic Surgery

## 2016-12-08 ENCOUNTER — Encounter (HOSPITAL_BASED_OUTPATIENT_CLINIC_OR_DEPARTMENT_OTHER): Payer: Self-pay | Admitting: Certified Registered"

## 2016-12-08 DIAGNOSIS — F17210 Nicotine dependence, cigarettes, uncomplicated: Secondary | ICD-10-CM | POA: Insufficient documentation

## 2016-12-08 DIAGNOSIS — S82492B Other fracture of shaft of left fibula, initial encounter for open fracture type I or II: Secondary | ICD-10-CM | POA: Insufficient documentation

## 2016-12-08 DIAGNOSIS — S82872A Displaced pilon fracture of left tibia, initial encounter for closed fracture: Secondary | ICD-10-CM | POA: Diagnosis not present

## 2016-12-08 DIAGNOSIS — S8262XB Displaced fracture of lateral malleolus of left fibula, initial encounter for open fracture type I or II: Secondary | ICD-10-CM | POA: Diagnosis present

## 2016-12-08 HISTORY — PX: EXTERNAL FIXATION REMOVAL: SHX5040

## 2016-12-08 HISTORY — PX: I & D EXTREMITY: SHX5045

## 2016-12-08 HISTORY — PX: ORIF FIBULA FRACTURE: SHX5114

## 2016-12-08 SURGERY — REMOVAL, EXTERNAL FIXATION DEVICE, LOWER EXTREMITY
Anesthesia: General | Site: Ankle | Laterality: Left

## 2016-12-08 MED ORDER — SCOPOLAMINE 1 MG/3DAYS TD PT72: 1.0000 | MEDICATED_PATCH | Freq: Once | TRANSDERMAL | Status: DC | PRN

## 2016-12-08 MED ORDER — FENTANYL CITRATE (PF) 100 MCG/2ML IJ SOLN
50.0000 ug | INTRAMUSCULAR | Status: AC | PRN
  Administered 2016-12-08: 25 ug via INTRAVENOUS
  Administered 2016-12-08: 100 ug via INTRAVENOUS
  Administered 2016-12-08: 25 ug via INTRAVENOUS
  Administered 2016-12-08 (×2): 50 ug via INTRAVENOUS

## 2016-12-08 MED ORDER — FENTANYL CITRATE (PF) 100 MCG/2ML IJ SOLN
INTRAMUSCULAR | Status: AC
  Filled 2016-12-08: qty 2

## 2016-12-08 MED ORDER — SODIUM CHLORIDE 0.9 % IR SOLN
Status: DC | PRN
Start: 2016-12-08 — End: 2016-12-08
  Administered 2016-12-08: 3000 mL

## 2016-12-08 MED ORDER — BUPIVACAINE-EPINEPHRINE 0.5% -1:200000 IJ SOLN
INTRAMUSCULAR | Status: DC | PRN
  Administered 2016-12-08: 20 mL

## 2016-12-08 MED ORDER — MIDAZOLAM HCL 2 MG/2ML IJ SOLN
INTRAMUSCULAR | Status: AC
  Filled 2016-12-08: qty 2

## 2016-12-08 MED ORDER — PROPOFOL 10 MG/ML IV BOLUS
INTRAVENOUS | Status: DC | PRN
  Administered 2016-12-08: 250 mg via INTRAVENOUS

## 2016-12-08 MED ORDER — 0.9 % SODIUM CHLORIDE (POUR BTL) OPTIME
TOPICAL | Status: DC | PRN
  Administered 2016-12-08: 200 mL

## 2016-12-08 MED ORDER — OXYCODONE HCL 5 MG PO TABS: 5.0000 mg | ORAL_TABLET | Freq: Once | ORAL | Status: DC | PRN

## 2016-12-08 MED ORDER — CEFAZOLIN SODIUM-DEXTROSE 2-4 GM/100ML-% IV SOLN
INTRAVENOUS | Status: AC
  Filled 2016-12-08: qty 100

## 2016-12-08 MED ORDER — EPHEDRINE 5 MG/ML INJ
INTRAVENOUS | Status: AC
  Filled 2016-12-08: qty 20

## 2016-12-08 MED ORDER — HYDROMORPHONE HCL 1 MG/ML IJ SOLN: 0.2500 mg | INTRAMUSCULAR | Status: DC | PRN

## 2016-12-08 MED ORDER — OXYCODONE HCL 5 MG/5ML PO SOLN: 5.0000 mg | Freq: Once | ORAL | Status: DC | PRN

## 2016-12-08 MED ORDER — MIDAZOLAM HCL 2 MG/2ML IJ SOLN
1.0000 mg | INTRAMUSCULAR | Status: DC | PRN
  Administered 2016-12-08: 2 mg via INTRAVENOUS

## 2016-12-08 MED ORDER — CHLORHEXIDINE GLUCONATE 4 % EX LIQD: 60.0000 mL | Freq: Once | CUTANEOUS | Status: DC

## 2016-12-08 MED ORDER — LIDOCAINE HCL (CARDIAC) 20 MG/ML IV SOLN
INTRAVENOUS | Status: DC | PRN
  Administered 2016-12-08: 30 mg via INTRAVENOUS

## 2016-12-08 MED ORDER — MEPERIDINE HCL 25 MG/ML IJ SOLN: 6.2500 mg | INTRAMUSCULAR | Status: DC | PRN

## 2016-12-08 MED ORDER — CEFAZOLIN SODIUM-DEXTROSE 2-4 GM/100ML-% IV SOLN
2.0000 g | INTRAVENOUS | Status: AC
  Administered 2016-12-08: 2 g via INTRAVENOUS

## 2016-12-08 MED ORDER — DEXAMETHASONE SODIUM PHOSPHATE 10 MG/ML IJ SOLN
INTRAMUSCULAR | Status: DC | PRN
  Administered 2016-12-08: 10 mg via INTRAVENOUS

## 2016-12-08 MED ORDER — SENNA 8.6 MG PO TABS: 2.0000 | ORAL_TABLET | Freq: Two times a day (BID) | ORAL | 0 refills | Status: DC

## 2016-12-08 MED ORDER — LACTATED RINGERS IV SOLN
INTRAVENOUS | Status: DC
  Administered 2016-12-08 (×2): via INTRAVENOUS

## 2016-12-08 MED ORDER — ROPIVACAINE HCL 5 MG/ML IJ SOLN
INTRAMUSCULAR | Status: DC | PRN
  Administered 2016-12-08: 50 mL via PERINEURAL

## 2016-12-08 MED ORDER — SODIUM CHLORIDE 0.9 % IV SOLN: INTRAVENOUS | Status: DC

## 2016-12-08 MED ORDER — PROMETHAZINE HCL 25 MG/ML IJ SOLN: 6.2500 mg | INTRAMUSCULAR | Status: DC | PRN

## 2016-12-08 SURGICAL SUPPLY — 93 items
BANDAGE ACE 4X5 VEL STRL LF (GAUZE/BANDAGES/DRESSINGS) IMPLANT
BANDAGE ESMARK 6X9 LF (GAUZE/BANDAGES/DRESSINGS) IMPLANT
BIT DRILL 2.5X2.75 QC CALB (BIT) ×1 IMPLANT
BIT DRILL CANN 3.5MM (DRILL) IMPLANT
BLADE SURG 15 STRL LF DISP TIS (BLADE) ×2 IMPLANT
BLADE SURG 15 STRL SS (BLADE) ×4
BNDG CMPR 9X6 STRL LF SNTH (GAUZE/BANDAGES/DRESSINGS) ×1
BNDG COHESIVE 4X5 TAN STRL (GAUZE/BANDAGES/DRESSINGS) ×2 IMPLANT
BNDG COHESIVE 6X5 TAN STRL LF (GAUZE/BANDAGES/DRESSINGS) ×2 IMPLANT
BNDG ESMARK 6X9 LF (GAUZE/BANDAGES/DRESSINGS) ×2
BNDG GAUZE ELAST 4 BULKY (GAUZE/BANDAGES/DRESSINGS) IMPLANT
BRUSH SCRUB EZ PLAIN DRY (MISCELLANEOUS) ×3 IMPLANT
CHLORAPREP W/TINT 26ML (MISCELLANEOUS) IMPLANT
COVER BACK TABLE 60X90IN (DRAPES) ×2 IMPLANT
CUFF TOURNIQUET SINGLE 34IN LL (TOURNIQUET CUFF) ×2 IMPLANT
DRAPE EXTREMITY T 121X128X90 (DRAPE) ×2 IMPLANT
DRAPE INCISE IOBAN 66X45 STRL (DRAPES) IMPLANT
DRAPE OEC MINIVIEW 54X84 (DRAPES) ×2 IMPLANT
DRAPE SURG 17X23 STRL (DRAPES) ×1 IMPLANT
DRAPE U-SHAPE 47X51 STRL (DRAPES) ×2 IMPLANT
DRILL CANN 3.5MM (DRILL) ×2
DRSG MEPITEL 4X7.2 (GAUZE/BANDAGES/DRESSINGS) ×2 IMPLANT
DRSG PAD ABDOMINAL 8X10 ST (GAUZE/BANDAGES/DRESSINGS) ×4 IMPLANT
GAUZE SPONGE 4X4 12PLY STRL (GAUZE/BANDAGES/DRESSINGS) ×2 IMPLANT
GAUZE XEROFORM 1X8 LF (GAUZE/BANDAGES/DRESSINGS) IMPLANT
GLOVE BIO SURGEON STRL SZ8 (GLOVE) ×2 IMPLANT
GLOVE BIOGEL PI IND STRL 7.0 (GLOVE) IMPLANT
GLOVE BIOGEL PI IND STRL 8 (GLOVE) ×2 IMPLANT
GLOVE BIOGEL PI INDICATOR 7.0 (GLOVE) ×2
GLOVE BIOGEL PI INDICATOR 8 (GLOVE) ×3
GLOVE ECLIPSE 6.5 STRL STRAW (GLOVE) ×2 IMPLANT
GLOVE ECLIPSE 8.0 STRL XLNG CF (GLOVE) ×2 IMPLANT
GOWN STRL REUS W/ TWL LRG LVL3 (GOWN DISPOSABLE) ×1 IMPLANT
GOWN STRL REUS W/ TWL XL LVL3 (GOWN DISPOSABLE) ×2 IMPLANT
GOWN STRL REUS W/TWL LRG LVL3 (GOWN DISPOSABLE) ×2
GOWN STRL REUS W/TWL XL LVL3 (GOWN DISPOSABLE) ×4
K-WIRE 1.8 (WIRE) ×2
K-WIRE FX200X1.8XTROC TIP (WIRE) ×1
KWIRE FX200X1.8XTROC TIP (WIRE) IMPLANT
MANIFOLD NEPTUNE II (INSTRUMENTS) ×2 IMPLANT
NEEDLE HYPO 22GX1.5 SAFETY (NEEDLE) IMPLANT
NS IRRIG 1000ML POUR BTL (IV SOLUTION) ×2 IMPLANT
PACK BASIN DAY SURGERY FS (CUSTOM PROCEDURE TRAY) ×2 IMPLANT
PAD ALCOHOL SWAB (MISCELLANEOUS) ×20 IMPLANT
PAD CAST 4YDX4 CTTN HI CHSV (CAST SUPPLIES) ×1 IMPLANT
PADDING CAST ABS 4INX4YD NS (CAST SUPPLIES) ×1
PADDING CAST ABS COTTON 4X4 ST (CAST SUPPLIES) ×1 IMPLANT
PADDING CAST COTTON 4X4 STRL (CAST SUPPLIES) ×2
PADDING CAST COTTON 6X4 STRL (CAST SUPPLIES) ×2 IMPLANT
PENCIL BUTTON HOLSTER BLD 10FT (ELECTRODE) ×2 IMPLANT
PLATE ACE 93X3 HL PILON NS TIB (Plate) IMPLANT
PLATE ACE MEDIAL (Plate) ×2 IMPLANT
PUTTY DBM STAGRAFT 5CC (Putty) ×1 IMPLANT
SANITIZER HAND PURELL 535ML FO (MISCELLANEOUS) ×2 IMPLANT
SCOTCHCAST PLUS 4X4 WHITE (CAST SUPPLIES) IMPLANT
SCREW CORT FT 32X3.5XNONLOCK (Screw) IMPLANT
SCREW CORTICAL 3.5MM  30MM (Screw) ×1 IMPLANT
SCREW CORTICAL 3.5MM  32MM (Screw) ×1 IMPLANT
SCREW CORTICAL 3.5MM  46MM (Screw) ×2 IMPLANT
SCREW CORTICAL 3.5MM  55MM (Screw) ×1 IMPLANT
SCREW CORTICAL 3.5MM 30MM (Screw) IMPLANT
SCREW CORTICAL 3.5MM 32MM (Screw) ×1 IMPLANT
SCREW CORTICAL 3.5MM 36MM (Screw) ×1 IMPLANT
SCREW CORTICAL 3.5MM 46MM (Screw) IMPLANT
SCREW CORTICAL 3.5MM 55MM (Screw) IMPLANT
SET IRRIG Y TYPE TUR BLADDER L (SET/KITS/TRAYS/PACK) ×2 IMPLANT
SHEET MEDIUM DRAPE 40X70 STRL (DRAPES) ×2 IMPLANT
SLEEVE SCD COMPRESS KNEE MED (MISCELLANEOUS) ×2 IMPLANT
SOAP 2% CHG 32OZ (WOUND CARE) IMPLANT
SOL PREP POV-IOD 16OZ 10% (MISCELLANEOUS) ×2 IMPLANT
SPLINT FAST PLASTER 5X30 (CAST SUPPLIES) ×20
SPLINT PLASTER CAST FAST 5X30 (CAST SUPPLIES) ×20 IMPLANT
SPONGE LAP 18X18 X RAY DECT (DISPOSABLE) ×2 IMPLANT
STOCKINETTE 6  STRL (DRAPES) ×1
STOCKINETTE 6 STRL (DRAPES) ×1 IMPLANT
STRIP CLOSURE SKIN 1/2X4 (GAUZE/BANDAGES/DRESSINGS) IMPLANT
SUCTION FRAZIER HANDLE 10FR (MISCELLANEOUS) ×1
SUCTION TUBE FRAZIER 10FR DISP (MISCELLANEOUS) ×1 IMPLANT
SURGILUBE 2OZ TUBE FLIPTOP (MISCELLANEOUS) IMPLANT
SUT ETHILON 2 0 FS 18 (SUTURE) ×2 IMPLANT
SUT ETHILON 3 0 PS 1 (SUTURE) ×2 IMPLANT
SUT MNCRL AB 3-0 PS2 18 (SUTURE) ×2 IMPLANT
SUT VIC AB 0 SH 27 (SUTURE) IMPLANT
SUT VIC AB 2-0 SH 27 (SUTURE) ×2
SUT VIC AB 2-0 SH 27XBRD (SUTURE) ×1 IMPLANT
SYR BULB 3OZ (MISCELLANEOUS) ×2 IMPLANT
SYR CONTROL 10ML LL (SYRINGE) IMPLANT
Screw Cann 5.0 x 60 MM partially threaded (Screw) ×1 IMPLANT
TOWEL OR 17X24 6PK STRL BLUE (TOWEL DISPOSABLE) ×4 IMPLANT
TRAY DSU PREP LF (CUSTOM PROCEDURE TRAY) ×2 IMPLANT
TUBE CONNECTING 20X1/4 (TUBING) ×2 IMPLANT
UNDERPAD 30X30 (UNDERPADS AND DIAPERS) ×4 IMPLANT
YANKAUER SUCT BULB TIP NO VENT (SUCTIONS) ×2 IMPLANT

## 2016-12-08 NOTE — Anesthesia Procedure Notes (Signed)
Anesthesia Regional Block: Popliteal block   Pre-Anesthetic Checklist: ,, timeout performed, Correct Patient, Correct Site, Correct Laterality, Correct Procedure, Correct Position, site marked, Risks and benefits discussed,  Surgical consent,  Pre-op evaluation,  At surgeon's request and post-op pain management  Laterality: Left  Prep: chloraprep       Needles:  Injection technique: Single-shot  Needle Type: Stimiplex     Needle Length: 9cm  Needle Gauge: 21     Additional Needles:   Procedures: ultrasound guided,,,,,,,,  Narrative:  Start time: 12/08/2016 9:35 AM End time: 12/08/2016 9:40 AM Injection made incrementally with aspirations every 5 mL.  Performed by: Personally  Anesthesiologist: Anitra LauthMILLER, Davan Nawabi RAY  Additional Notes: Additional 20ml Ropivacaine in Adductor Canal

## 2016-12-08 NOTE — Transfer of Care (Signed)
Immediate Anesthesia Transfer of Care Note  Patient: Avel SensorMichael B Helin  Procedure(s) Performed: Procedure(s): REMOVAL OF EXTERNAL FIXATION LEFT ANKLE (Left) IRRIGATION AND DEBRIDEMENT OPEN FRACTURE LEFT ANKLE (Left) OPEN REDUCTION INTERNAL FIXATION (ORIF) LEFT PILON AND FIBULA FRACTURE (Left)  Patient Location: PACU  Anesthesia Type:GA combined with regional for post-op pain  Level of Consciousness: awake and patient cooperative  Airway & Oxygen Therapy: Patient Spontanous Breathing and Patient connected to face mask oxygen  Post-op Assessment: Report given to RN and Post -op Vital signs reviewed and stable  Post vital signs: Reviewed and stable  Last Vitals:  Vitals:   12/08/16 0945 12/08/16 1222  BP: (!) 146/85 132/82  Pulse: (!) 107 98  Resp: 13 19  Temp:  (P) 37 C  SpO2: 100% 97%    Last Pain:  Vitals:   12/08/16 0832  TempSrc: Oral  PainSc: 6       Patients Stated Pain Goal: 4 (12/08/16 [id number])  Complications: No apparent anesthesia complications

## 2016-12-08 NOTE — Progress Notes (Signed)
Assisted Dr. Miller with left, ultrasound guided, popliteal, adductor canal block. Side rails up, monitors on throughout procedure. See vital signs in flow sheet. Tolerated Procedure well. 

## 2016-12-08 NOTE — Anesthesia Preprocedure Evaluation (Signed)
Anesthesia Evaluation  Patient identified by MRN, date of birth, ID band Patient awake    Reviewed: Allergy & Precautions, H&P , NPO status , Patient's Chart, lab work & pertinent test results  Airway Mallampati: II   Neck ROM: Limited   Comment: Cervical collar on Dental  (+) Teeth Intact   Pulmonary Current Smoker,    breath sounds clear to auscultation       Cardiovascular negative cardio ROS   Rhythm:regular Rate:Normal     Neuro/Psych    GI/Hepatic   Endo/Other    Renal/GU      Musculoskeletal   Abdominal   Peds  Hematology   Anesthesia Other Findings   Reproductive/Obstetrics                             Anesthesia Physical  Anesthesia Plan  ASA: II and emergent  Anesthesia Plan: General   Post-op Pain Management: GA combined w/ Regional for post-op pain   Induction: Intravenous  PONV Risk Score and Plan: 1 and Ondansetron  Airway Management Planned: LMA  Additional Equipment:   Intra-op Plan:   Post-operative Plan: Extubation in OR  Informed Consent: I have reviewed the patients History and Physical, chart, labs and discussed the procedure including the risks, benefits and alternatives for the proposed anesthesia with the patient or authorized representative who has indicated his/her understanding and acceptance.     Plan Discussed with: CRNA, Anesthesiologist and Surgeon  Anesthesia Plan Comments:         Anesthesia Quick Evaluation

## 2016-12-08 NOTE — Anesthesia Procedure Notes (Signed)
Procedure Name: LMA Insertion Date/Time: 12/08/2016 10:00 AM Performed by: Verna Desrocher D Pre-anesthesia Checklist: Patient identified, Emergency Drugs available, Suction available and Patient being monitored Patient Re-evaluated:Patient Re-evaluated prior to induction Oxygen Delivery Method: Circle system utilized Preoxygenation: Pre-oxygenation with 100% oxygen Induction Type: IV induction Ventilation: Mask ventilation without difficulty LMA: LMA inserted LMA Size: 4.0 Number of attempts: 1 Airway Equipment and Method: Bite block Placement Confirmation: positive ETCO2 Tube secured with: Tape Dental Injury: Teeth and Oropharynx as per pre-operative assessment

## 2016-12-08 NOTE — Anesthesia Postprocedure Evaluation (Signed)
Anesthesia Post Note  Patient: Jeffrey Braun  Procedure(s) Performed: Procedure(s) (LRB): REMOVAL OF EXTERNAL FIXATION LEFT ANKLE (Left) IRRIGATION AND DEBRIDEMENT OPEN FRACTURE LEFT ANKLE (Left) OPEN REDUCTION INTERNAL FIXATION (ORIF) LEFT PILON AND FIBULA FRACTURE (Left)     Patient location during evaluation: PACU Anesthesia Type: General Level of consciousness: awake and alert Pain management: pain level controlled Vital Signs Assessment: post-procedure vital signs reviewed and stable Respiratory status: spontaneous breathing, nonlabored ventilation and respiratory function stable Cardiovascular status: blood pressure returned to baseline and stable Postop Assessment: no signs of nausea or vomiting Anesthetic complications: no    Last Vitals:  Vitals:   12/08/16 1245 12/08/16 1300  BP: (!) 144/96 131/76  Pulse: (!) 122 94  Resp: (!) 22 20  Temp:    SpO2: 95% 94%    Last Pain:  Vitals:   12/08/16 1300  TempSrc:   PainSc: 0-No pain                 Lowella CurbWarren Ray Miller

## 2016-12-08 NOTE — H&P (Signed)
Jeffrey Braun is an 31 y.o. male.   Chief Complaint:  Left ankle pain HPI:  31 y/o male with PMH of smoking c/o left ankle pain since an open fracture dislocation over a week ago.  He presents today for a planned return to the operating room for a staged procedure.  He underwent open reduction and I and D of the fracture with application of an external fixator.  Ultrasound of the L leg yesterday showed no DVT.  Past Medical History:  Diagnosis Date  . Neck pain     Past Surgical History:  Procedure Laterality Date  . ABDOMINAL SURGERY    . CLEFT PALATE REPAIR    . EXTERNAL FIXATION LEG Left 11/30/2016   Procedure: APPLICATION EXTERNAL FIXATION LEFT  LEG;  Surgeon: Toni ArthursHewitt, Kassadie Pancake, MD;  Location: [facility] OR;  Service: Orthopedics;  Laterality: Left;  . I&D EXTREMITY Left 11/30/2016   Procedure: IRRIGATION AND DEBRIDEMENT LEFT ANKLE;  Surgeon: Toni ArthursHewitt, Journee Bobrowski, MD;  Location: [facility] OR;  Service: Orthopedics;  Laterality: Left;  . NECK SURGERY      History reviewed. No pertinent family history. Social History:  reports that he has been smoking Cigarettes.  He has been smoking about 1.00 pack per day. He has quit using smokeless tobacco. His smokeless tobacco use included Snuff. He reports that he drinks alcohol. He reports that he uses drugs, including Marijuana.  Allergies: No Known Allergies  Medications Prior to Admission  Medication Sig Dispense Refill  . docusate sodium (COLACE) 100 MG capsule Take 100 mg by mouth daily as needed for mild constipation.    Marland Kitchen. HYDROcodone-acetaminophen (NORCO/VICODIN) 5-325 MG tablet Take 1-2 tablets by mouth every 6 (six) hours as needed for moderate pain.    Marland Kitchen. enoxaparin (LOVENOX) 40 MG/0.4ML injection Inject 0.4 mLs (40 mg total) into the skin daily. 6 mL 0    No results found for this or any previous visit (from the past 48 hour(s)). No results found.  ROS  No recent f/c/n/v/wt loss.  Blood pressure (!) 161/89, pulse (!) 105, temperature 97.9 F (36.6 C),  temperature source Oral, resp. rate 13, height 5\' 10"  (1.778 m), weight 100.9 kg (222 lb 6.4 oz), SpO2 100 %. Physical Exam  wn wd male in nad.  A and o x 4.  Mood and affect normal.  EOMI.  Resp unlabored.  L ankle with delta frame external fixator in place.  Sens to LT intact at the foot.  No lymphadenopathy.  Brisk cap refill at the toes.  5/5 strength in PF and DF of the ankle and toes.  Assessment/Plan L ankle fracture dislocation - to OR for removal of ex fix, I and D of open fracture, ORIF of fibula and tibial pilon fractures.  The risks and benefits of the alternative treatment options have been discussed in detail.  The patient wishes to proceed with surgery and specifically understands risks of bleeding, infection, nerve damage, blood clots, need for additional surgery, amputation and death.   Toni ArthursHEWITT, Clarrisa Kaylor, MD 12/08/2016, 9:30 AM

## 2016-12-08 NOTE — Discharge Instructions (Addendum)
Toni ArthursJohn Hewitt, MD [facility]  Please read the following information regarding your care after surgery.  Medications  You only need a prescription for the narcotic pain medicine (ex. oxycodone, Percocet, Norco).  All of the other medicines listed below are available over the counter. X Aleve 2 pills twice a day for the first 3 days after surgery. X acetominophen (Tylenol) 650 mg every 4-6 hours as you need for minor to moderate pain X oxycodone as prescribed for severe pain  Narcotic pain medicine (ex. oxycodone, Percocet, Vicodin) will cause constipation.  To prevent this problem, take the following medicines while you are taking any pain medicine. X docusate sodium (Colace) 100 mg twice a day X senna (Senokot) 2 tablets twice a day  X To help prevent blood clots, inject the lovenox subcutaneously as previously prescribed.  If you do not take the lovenox you need to at least take a baby aspirin (81 mg) twice a day after surgery until instructed otherwise.  You should also get up every hour while you are awake to move around.    Weight Bearing X Do not bear any weight on the operated leg or foot.  Cast / Splint / Dressing X Keep your splint, cast or dressing clean and dry.  Dont put anything (coat hanger, pencil, etc) down inside of it.  If it gets damp, use a hair dryer on the cool setting to dry it.  If it gets soaked, call the office to schedule an appointment for a cast change.  After your dressing, cast or splint is removed; you may shower, but do not soak or scrub the wound.  Allow the water to run over it, and then gently pat it dry.  Swelling It is normal for you to have swelling where you had surgery.  To reduce swelling and pain, keep your toes above your nose for at least 3 days after surgery.  It may be necessary to keep your foot or leg elevated for several weeks.  If it hurts, it should be elevated.  Follow Up Call my office at [phone] when you are discharged  from the hospital or surgery center to schedule an appointment to be seen two weeks after surgery.  Call my office at [phone] if you develop a fever >101.5 F, nausea, vomiting, bleeding from the surgical site or severe pain.     Post Anesthesia Home Care Instructions  Activity: Get plenty of rest for the remainder of the day. A responsible individual must stay with you for 24 hours following the procedure.  For the next 24 hours, DO NOT: -Drive a car -Advertising copywriterperate machinery -Drink alcoholic beverages -Take any medication unless instructed by your physician -Make any legal decisions or sign important papers.  Meals: Start with liquid foods such as gelatin or soup. Progress to regular foods as tolerated. Avoid greasy, spicy, heavy foods. If nausea and/or vomiting occur, drink only clear liquids until the nausea and/or vomiting subsides. Call your physician if vomiting continues.  Special Instructions/Symptoms: Your throat may feel dry or sore from the anesthesia or the breathing tube placed in your throat during surgery. If this causes discomfort, gargle with warm salt water. The discomfort should disappear within 24 hours.  If you had a scopolamine patch placed behind your ear for the management of post- operative nausea and/or vomiting:  1. The medication in the patch is effective for 72 hours, after which it should be removed.  Wrap patch in a tissue and discard in the trash.  Wash hands thoroughly with soap and water. 2. You may remove the patch earlier than 72 hours if you experience unpleasant side effects which may include dry mouth, dizziness or visual disturbances. 3. Avoid touching the patch. Wash your hands with soap and water after contact with the patch.   Regional Anesthesia Blocks  1. Numbness or the inability to move the "blocked" extremity may last from 3-48 hours after placement. The length of time depends on the medication injected and your individual response to the  medication. If the numbness is not going away after 48 hours, call your surgeon.  2. The extremity that is blocked will need to be protected until the numbness is gone and the  Strength has returned. Because you cannot feel it, you will need to take extra care to avoid injury. Because it may be weak, you may have difficulty moving it or using it. You may not know what position it is in without looking at it while the block is in effect.  3. For blocks in the legs and feet, returning to weight bearing and walking needs to be done carefully. You will need to wait until the numbness is entirely gone and the strength has returned. You should be able to move your leg and foot normally before you try and bear weight or walk. You will need someone to be with you when you first try to ensure you do not fall and possibly risk injury.  4. Bruising and tenderness at the needle site are common side effects and will resolve in a few days.  5. Persistent numbness or new problems with movement should be communicated to the surgeon or the [facility] [phone] [facility] [phone]).
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
## 2016-12-16 ENCOUNTER — Encounter (HOSPITAL_BASED_OUTPATIENT_CLINIC_OR_DEPARTMENT_OTHER): Payer: Self-pay | Admitting: Orthopedic Surgery
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
## 2018-11-03 DIAGNOSIS — K7200 Acute and subacute hepatic failure without coma: Secondary | ICD-10-CM

## 2018-11-03 DIAGNOSIS — K7010 Alcoholic hepatitis without ascites: Secondary | ICD-10-CM

## 2018-11-03 HISTORY — DX: Acute and subacute hepatic failure without coma: K72.00

## 2018-11-03 HISTORY — DX: Alcoholic hepatitis without ascites: K70.10

## 2018-11-27 ENCOUNTER — Encounter: Payer: Self-pay | Admitting: Gastroenterology

## 2018-11-27 DIAGNOSIS — R197 Diarrhea, unspecified: Secondary | ICD-10-CM

## 2018-11-27 DIAGNOSIS — K7010 Alcoholic hepatitis without ascites: Secondary | ICD-10-CM | POA: Diagnosis not present

## 2018-11-27 DIAGNOSIS — R509 Fever, unspecified: Secondary | ICD-10-CM

## 2018-11-27 DIAGNOSIS — F1010 Alcohol abuse, uncomplicated: Secondary | ICD-10-CM | POA: Diagnosis not present

## 2018-11-27 NOTE — Progress Notes (Signed)
Call[facility] and spoke with on-call ED provider about a patient who has progressive transferase elevation. Reported history of alcohol use as well as steroid use but no IV drug use per report although cannot be certain based on what the emergency department provider is able to tell. Presented with abdominal pain, malaise, depression/anxiety symptoms yesterday and discharge. Mental status without issue. Reported transferase is in the 800s yesterday and was discharged from ED. Patient returns today with transferase is now in the 4000 range.  Bilirubin of 1.4.  Alkaline phosphatase in the 90s. No INR has been sent on both days. I was asked asked whether this patient would be able to be monitored or evaluated as an outpatient. Although [facility], I would not feel comfortable that this patient is a candidate for outpatient supportive management. He has some evidence of progressive liver injury that needs to be further clarified. He needs to have a acute hepatitis panel as well as an INR as well as ANA and AMA and anti-smooth muscle antibody performed.  Hepatitis C RNA should be sent.  He should have a Tylenol level rechecked at 8 hours (reported that the most recent lab was negative).  I would initiate aggressive IV hydration and I would also initiate the patient on IV N-acetylcysteine as per Tylenol overdose protocol in case the patient is having a delayed reaction. Although I have not formally reviewed the imaging (not on CANOPY to see), the patient has had a CT abdomen/pelvis without oral contrast or IV contrast done yesterday because he had concern for whether there was renal stones playing a role for his abdominal pain.  There is report of a splenic lesion that is stable from 2018 prior CT scan that is a loculated cyst. A liver ultrasound was performed showing no gallbladder pathology and no biliary ductal dilation.  There is a report of normal portal  venous flow without clot being noted. Overall patient has evidence of acute liver injury but has a normal mental status and is alert and oriented x4 currently. I have asked the [facility] to consider admission of this patient and if they do not feel comfortable then the team should reach out to the hospital team here to see whether he would be a candidate for transfer. Based on the reported patient alcohol consumption is he would not likely be a liver transplant candidate based on the preliminary discussion however final determination would always be done at a quaternary center where liver transplant could be offered if he has progressive liver dysfunction develop. I asked him to check an INR and if he has an elevation above 1.3 he should receive 10 mg of IV vitamin K. The patient should have repeat liver tests performed within the next 8 hours, approximately around midnight or 1 AM, since his initial onset of labs and they should be monitored and followed closely were performed at 4 PM. If at any time point it is felt that the patient is not a candidate for being monitored or supported at the current hospital then I think transfer for close monitoring is reasonable to [facility] or potentially to 1 of the other quaternary centers especially if his bilirubin begins to rise. This feels like this is an acute hepatitis A or hepatitis B infection most likely versus a drug-induced liver injury. Again, these were recommendations that were provided through [facility] to the outside providers and they were aware that we do not  have privileges at [facility]. However if they do not feel comfortable with managing this patient then he likely needs to be transferred. This conversation lasted approximately 15 minutes. If the patient is transferred to a [facility] facility they will need to reach out to let us know that the patient has been transferred.  Happy to be of assistance if needed  further.   Justice Britain, MD [facility] Office # [phone]

## 2018-11-28 ENCOUNTER — Encounter (HOSPITAL_COMMUNITY): Payer: Self-pay | Admitting: Internal Medicine

## 2018-11-28 ENCOUNTER — Inpatient Hospital Stay (HOSPITAL_COMMUNITY)
Admission: AD | Admit: 2018-11-28 | Discharge: 2018-12-04 | DRG: 871 | Disposition: A | Discharge status: Home / Self Care | Payer: BLUE CROSS/BLUE SHIELD | Source: Other Acute Inpatient Hospital | Attending: Internal Medicine | Admitting: Internal Medicine

## 2018-11-28 DIAGNOSIS — R509 Fever, unspecified: Secondary | ICD-10-CM | POA: Diagnosis present

## 2018-11-28 DIAGNOSIS — F1490 Cocaine use, unspecified, uncomplicated: Secondary | ICD-10-CM | POA: Diagnosis present

## 2018-11-28 DIAGNOSIS — F419 Anxiety disorder, unspecified: Secondary | ICD-10-CM | POA: Diagnosis present

## 2018-11-28 DIAGNOSIS — F17210 Nicotine dependence, cigarettes, uncomplicated: Secondary | ICD-10-CM | POA: Diagnosis present

## 2018-11-28 DIAGNOSIS — Z8249 Family history of ischemic heart disease and other diseases of the circulatory system: Secondary | ICD-10-CM

## 2018-11-28 DIAGNOSIS — G8929 Other chronic pain: Secondary | ICD-10-CM | POA: Diagnosis present

## 2018-11-28 DIAGNOSIS — Z20828 Contact with and (suspected) exposure to other viral communicable diseases: Secondary | ICD-10-CM | POA: Diagnosis present

## 2018-11-28 DIAGNOSIS — B159 Hepatitis A without hepatic coma: Secondary | ICD-10-CM | POA: Diagnosis present

## 2018-11-28 DIAGNOSIS — Z7901 Long term (current) use of anticoagulants: Secondary | ICD-10-CM

## 2018-11-28 DIAGNOSIS — D696 Thrombocytopenia, unspecified: Secondary | ICD-10-CM | POA: Diagnosis present

## 2018-11-28 DIAGNOSIS — K7200 Acute and subacute hepatic failure without coma: Secondary | ICD-10-CM | POA: Diagnosis present

## 2018-11-28 DIAGNOSIS — D689 Coagulation defect, unspecified: Secondary | ICD-10-CM | POA: Diagnosis present

## 2018-11-28 DIAGNOSIS — D734 Cyst of spleen: Secondary | ICD-10-CM | POA: Diagnosis present

## 2018-11-28 DIAGNOSIS — Z590 Homelessness: Secondary | ICD-10-CM

## 2018-11-28 DIAGNOSIS — T391X5A Adverse effect of 4-Aminophenol derivatives, initial encounter: Secondary | ICD-10-CM | POA: Diagnosis present

## 2018-11-28 DIAGNOSIS — A419 Sepsis, unspecified organism: Principal | ICD-10-CM | POA: Diagnosis present

## 2018-11-28 DIAGNOSIS — K7010 Alcoholic hepatitis without ascites: Secondary | ICD-10-CM | POA: Diagnosis present

## 2018-11-28 DIAGNOSIS — Y929 Unspecified place or not applicable: Secondary | ICD-10-CM

## 2018-11-28 DIAGNOSIS — Z560 Unemployment, unspecified: Secondary | ICD-10-CM

## 2018-11-28 DIAGNOSIS — Z791 Long term (current) use of non-steroidal anti-inflammatories (NSAID): Secondary | ICD-10-CM

## 2018-11-28 DIAGNOSIS — E876 Hypokalemia: Secondary | ICD-10-CM | POA: Diagnosis present

## 2018-11-28 DIAGNOSIS — F1990 Other psychoactive substance use, unspecified, uncomplicated: Secondary | ICD-10-CM | POA: Diagnosis present

## 2018-11-28 DIAGNOSIS — Z79899 Other long term (current) drug therapy: Secondary | ICD-10-CM

## 2018-11-28 DIAGNOSIS — F320 Major depressive disorder, single episode, mild: Secondary | ICD-10-CM | POA: Diagnosis present

## 2018-11-28 DIAGNOSIS — E871 Hypo-osmolality and hyponatremia: Secondary | ICD-10-CM | POA: Diagnosis present

## 2018-11-28 DIAGNOSIS — R51 Headache: Secondary | ICD-10-CM | POA: Diagnosis present

## 2018-11-28 DIAGNOSIS — B199 Unspecified viral hepatitis without hepatic coma: Secondary | ICD-10-CM | POA: Diagnosis present

## 2018-11-28 DIAGNOSIS — F1020 Alcohol dependence, uncomplicated: Secondary | ICD-10-CM | POA: Diagnosis present

## 2018-11-28 DIAGNOSIS — D6959 Other secondary thrombocytopenia: Secondary | ICD-10-CM | POA: Diagnosis present

## 2018-11-28 LAB — PROTIME-INR
INR: 1.9 — ABNORMAL HIGH (ref 0.8–1.2)
Prothrombin Time: 21.9 seconds — ABNORMAL HIGH (ref 11.4–15.2)

## 2018-11-28 LAB — CBC WITH DIFFERENTIAL/PLATELET
Abs Immature Granulocytes: 0.04 10*3/uL (ref 0.00–0.07)
Basophils Absolute: 0 10*3/uL (ref 0.0–0.1)
Basophils Relative: 1 %
Eosinophils Absolute: 0 10*3/uL (ref 0.0–0.5)
Eosinophils Relative: 1 %
HCT: 40.5 % (ref 39.0–52.0)
Hemoglobin: 14.7 g/dL (ref 13.0–17.0)
Immature Granulocytes: 1 %
Lymphocytes Relative: 25 %
Lymphs Abs: 0.8 10*3/uL (ref 0.7–4.0)
MCH: 33.2 pg (ref 26.0–34.0)
MCHC: 36.3 g/dL — ABNORMAL HIGH (ref 30.0–36.0)
MCV: 91.4 fL (ref 80.0–100.0)
Monocytes Absolute: 0.2 10*3/uL (ref 0.1–1.0)
Monocytes Relative: 5 %
Neutro Abs: 2.2 10*3/uL (ref 1.7–7.7)
Neutrophils Relative %: 67 %
Platelets: 94 10*3/uL — ABNORMAL LOW (ref 150–400)
RBC: 4.43 MIL/uL (ref 4.22–5.81)
RDW: 12.3 % (ref 11.5–15.5)
WBC Morphology: INCREASED
WBC: 3.3 10*3/uL — ABNORMAL LOW (ref 4.0–10.5)
nRBC: 0 % (ref 0.0–0.2)

## 2018-11-28 LAB — COMPREHENSIVE METABOLIC PANEL
ALT: 6967 U/L — ABNORMAL HIGH (ref 0–44)
AST: >10000 U/L — ABNORMAL HIGH (ref 15–41)
Albumin: 3.2 g/dL — ABNORMAL LOW (ref 3.5–5.0)
Alkaline Phosphatase: 117 U/L (ref 38–126)
Anion gap: 11 (ref 5–15)
BUN: 9 mg/dL (ref 6–20)
CO2: 21 mmol/L — ABNORMAL LOW (ref 22–32)
Calcium: 7.9 mg/dL — ABNORMAL LOW (ref 8.9–10.3)
Chloride: 98 mmol/L (ref 98–111)
Creatinine, Ser: 0.87 mg/dL (ref 0.61–1.24)
GFR calc Af Amer: >60 mL/min (ref >60)
GFR calc non Af Amer: >60 mL/min (ref >60)
Glucose, Bld: 108 mg/dL — ABNORMAL HIGH (ref 70–99)
Potassium: 3.4 mmol/L — ABNORMAL LOW (ref 3.5–5.1)
Sodium: 130 mmol/L — ABNORMAL LOW (ref 135–145)
Total Bilirubin: 2.8 mg/dL — ABNORMAL HIGH (ref 0.3–1.2)
Total Protein: 5.8 g/dL — ABNORMAL LOW (ref 6.5–8.1)

## 2018-11-28 LAB — PROCALCITONIN: Procalcitonin: 0.95 ng/mL

## 2018-11-28 LAB — APTT: aPTT: 52 seconds — ABNORMAL HIGH (ref 24–36)

## 2018-11-28 LAB — LACTIC ACID, PLASMA: Lactic Acid, Venous: 1.2 mmol/L (ref 0.5–1.9)

## 2018-11-28 LAB — CORTISOL: Cortisol, Plasma: 13.7 ug/dL

## 2018-11-28 MED ORDER — IBUPROFEN 600 MG PO TABS
600.0000 mg | ORAL_TABLET | ORAL | Status: DC | PRN
  Administered 2018-11-29 – 2018-12-01 (×6): 600 mg via ORAL
  Filled 2018-11-28 (×6): qty 1

## 2018-11-28 MED ORDER — SODIUM CHLORIDE 0.9 % IV SOLN
2.0000 g | Freq: Three times a day (TID) | INTRAVENOUS | Status: DC
  Administered 2018-11-28 – 2018-11-29 (×3): 2 g via INTRAVENOUS
  Filled 2018-11-28 (×2): qty 2

## 2018-11-28 MED ORDER — VITAMIN B-1 100 MG PO TABS
100.0000 mg | ORAL_TABLET | Freq: Every day | ORAL | Status: DC
  Administered 2018-11-29 – 2018-12-04 (×7): 100 mg via ORAL
  Filled 2018-11-28 (×6): qty 1

## 2018-11-28 MED ORDER — SODIUM CHLORIDE 0.9 % IV SOLN
INTRAVENOUS | Status: AC
  Administered 2018-11-28 – 2018-11-30 (×2): via INTRAVENOUS

## 2018-11-28 MED ORDER — LORAZEPAM 2 MG/ML IJ SOLN
1.0000 mg | Freq: Four times a day (QID) | INTRAMUSCULAR | Status: DC | PRN
  Administered 2018-11-28: 1 mg via INTRAVENOUS
  Filled 2018-11-28: qty 1

## 2018-11-28 MED ORDER — SODIUM CHLORIDE 0.9 % IV SOLN
2.0000 g | Freq: Once | INTRAVENOUS | Status: DC
  Filled 2018-11-28: qty 2

## 2018-11-28 MED ORDER — LORAZEPAM 1 MG PO TABS
1.0000 mg | ORAL_TABLET | Freq: Four times a day (QID) | ORAL | Status: DC | PRN
  Administered 2018-11-29: 1 mg via ORAL
  Filled 2018-11-28: qty 1

## 2018-11-28 MED ORDER — KETOROLAC TROMETHAMINE 15 MG/ML IJ SOLN
15.0000 mg | Freq: Four times a day (QID) | INTRAMUSCULAR | Status: DC | PRN
  Administered 2018-11-28: 15 mg via INTRAVENOUS
  Filled 2018-11-28: qty 1

## 2018-11-28 MED ORDER — VANCOMYCIN HCL 10 G IV SOLR
2000.0000 mg | Freq: Once | INTRAVENOUS | Status: AC
  Administered 2018-11-29: 2000 mg via INTRAVENOUS
  Filled 2018-11-28: qty 2000

## 2018-11-28 MED ORDER — ADULT MULTIVITAMIN W/MINERALS CH
1.0000 | ORAL_TABLET | Freq: Every day | ORAL | Status: DC
  Administered 2018-11-29 – 2018-12-04 (×7): 1 via ORAL
  Filled 2018-11-28 (×6): qty 1

## 2018-11-28 MED ORDER — VANCOMYCIN HCL 10 G IV SOLR
1500.0000 mg | Freq: Three times a day (TID) | INTRAVENOUS | Status: DC
  Administered 2018-11-29: 1500 mg via INTRAVENOUS
  Filled 2018-11-28 (×4): qty 1500

## 2018-11-28 MED ORDER — TRAZODONE HCL 50 MG PO TABS
50.0000 mg | ORAL_TABLET | Freq: Every evening | ORAL | Status: DC | PRN
  Administered 2018-11-28 – 2018-11-30 (×3): 50 mg via ORAL
  Filled 2018-11-28 (×3): qty 1

## 2018-11-28 MED ORDER — VANCOMYCIN HCL IN DEXTROSE 1-5 GM/200ML-% IV SOLN: 1000.0000 mg | Freq: Once | INTRAVENOUS | Status: DC

## 2018-11-28 MED ORDER — DEXTROSE 5 % IV SOLN
15.0000 mg/kg/h | INTRAVENOUS | Status: AC
  Administered 2018-11-28: 15 mg/kg/h via INTRAVENOUS
  Filled 2018-11-28 (×2): qty 120

## 2018-11-28 MED ORDER — ONDANSETRON HCL 4 MG PO TABS: 4.0000 mg | ORAL_TABLET | Freq: Four times a day (QID) | ORAL | Status: DC | PRN

## 2018-11-28 MED ORDER — SODIUM CHLORIDE 0.9% FLUSH: 3.0000 mL | INTRAVENOUS | Status: DC | PRN

## 2018-11-28 MED ORDER — METRONIDAZOLE IN NACL 5-0.79 MG/ML-% IV SOLN
500.0000 mg | Freq: Three times a day (TID) | INTRAVENOUS | Status: DC
  Administered 2018-11-29 (×3): 500 mg via INTRAVENOUS
  Filled 2018-11-28 (×3): qty 100

## 2018-11-28 MED ORDER — SODIUM CHLORIDE 0.9 % IV SOLN: 250.0000 mL | INTRAVENOUS | Status: DC | PRN

## 2018-11-28 MED ORDER — ONDANSETRON HCL 4 MG/2ML IJ SOLN: 4.0000 mg | Freq: Four times a day (QID) | INTRAMUSCULAR | Status: DC | PRN

## 2018-11-28 MED ORDER — ENOXAPARIN SODIUM 40 MG/0.4ML ~~LOC~~ SOLN
40.0000 mg | SUBCUTANEOUS | Status: DC
  Administered 2018-11-28 – 2018-12-03 (×6): 40 mg via SUBCUTANEOUS
  Filled 2018-11-28 (×6): qty 0.4

## 2018-11-28 MED ORDER — FOLIC ACID 1 MG PO TABS
1.0000 mg | ORAL_TABLET | Freq: Every day | ORAL | Status: DC
  Administered 2018-11-28 – 2018-12-04 (×7): 1 mg via ORAL
  Filled 2018-11-28 (×6): qty 1

## 2018-11-28 MED ORDER — SODIUM CHLORIDE 0.9% FLUSH
3.0000 mL | Freq: Two times a day (BID) | INTRAVENOUS | Status: DC
  Administered 2018-11-29 – 2018-12-01 (×6): 3 mL via INTRAVENOUS
  Administered 2018-12-02: 6 mL via INTRAVENOUS
  Administered 2018-12-02 – 2018-12-04 (×4): 3 mL via INTRAVENOUS

## 2018-11-28 NOTE — Progress Notes (Signed)
Jeffrey Braun [id number] Admission Data: 11/28/2018 6:59 PM Attending Provider: Antonieta Pert, MD  TKW:[id number], No Pcp Per Consults/ Treatment Team:   Jeffrey Braun is a 33 y.o. male patient admitted from ED awake, alert  & orientated  X 3,  Prior, VSS - Blood pressure (!) 157/102, pulse (!) 117, temperature (!) 103.1 F (39.5 C), temperature source Oral, resp. rate 19., no c/o shortness of breath, no c/o chest pain, no distress noted.  IV site WDL:  antecubital right, condition patent and no redness and upper arm left, condition patent and no redness with a transparent dsg that's clean dry and intact.  Allergies:  No Known Allergies   Past Medical History:  Diagnosis Date  . Neck pain     History:  obtained from chart review. Tobacco/alcohol: unknown tobacco use ETOH use but not sure how much  Pt orientation to unit, room and routine. Information packet given to patient/family and safety video watched.  Admission INP armband ID verified with patient/family, and in place. SR up x 2, fall risk assessment complete with Patient and family verbalizing understanding of risks associated with falls. Pt verbalizes an understanding of how to use the call bell and to call for help before getting out of bed.  Skin, clean-dry- intact without evidence of bruising, or skin tears.   No evidence of skin break down noted on exam. no rashes, no ecchymoses, no petechiae, no nodules, no jaundice, no purpura, no wounds 600 mg Ibuprofen given per VO from Dr. Evangeline Gula for 103.1 fever    Will cont to monitor and assist as needed. Cathlean Marseilles Tamala Julian, MSN, RN, [place], [place]  11/28/2018 6:59 PM

## 2018-11-28 NOTE — H&P (Signed)
History and Physical    JORDAAN CURET IDU:[id number] DOB: [birth year] DOA: 11/28/2018  PCP: Patient, No Pcp Per  Patient coming from: [facility]  I have personally briefly reviewed patient's old medical records in [facility] Link  Chief Complaint: Headaches fevers chills  HPI: PLATON BRACHER is a 33 y.o. male with medical history significant of psychiatric illness, alcohol abuse, comes to the ER [facility] for evaluation of 2 week history of increased anxiety, hearing ringing sensation in his ears, having headaches, fever, chills and shaking.  Patient was initially seen the morning of 8/25 with similar illness. He c/o diarrhea for 1 week, having fever, feeling chills , shaky, pain all over especially headache. report having fever since 3 days. no known sick contact or covid positive contacts.He has been drinking at-least 6 pack beer daily and last 3 days "heavy alcohol". He also admits taking OTC-pain meds- ibuprofen, tylenol on regular basis " I JUST POP IN FEW PILLS" for knee pain whenever needed, almost on daily basis and reports not taking too much tylenol, limits to less than 2100 mg/day. He reported that he was seen in [facility] earlier in the week  for psych eval and was released. In ER labs were abnormal with increased LFT, GI was consulted and appropriate for admission here.  In the ER he was alert but restless, with fast speech, nonfocal.  Patient was seen by tele psych. in ER he has had 2-COVID test. Stated he used cocaine and it was one time. gi physician Dr Hurshel Keys at [facility] was consulted- advised admission- NAC, LR 150 ML/HR, serial LFTs and INRs. CT head, CT abdomen pelvis no acute finding, chest x-ray no active disease,RUQ [place] was unremarkable.  Overnight patient has had continued fever, diarrhea generalized pain and a congested feeling face head and back.  He has been moving all his extremities normally. His LFTs has continued to worsen and his AST is now  greater than 10,000 and his ALT is greater than 5200.  His INR has gone up as well he has a history of alcohol abuse been taking ibuprofen and Tylenol at home.  His mattery discrimination score was 19 (less than 32, shows no indication for steroids) his case was discussed with Dr. Coral Spikes at [facility] who advised N-acetylcysteine on fusion for 21 hours.  His hepatitis panel came back positive for hepatitis A IgM.  When see hepatology, Dr. Piedad Climes was consulted.  She felt that the patient definitely has an acute hepatitis A infection but total bili is not high enough as expected in acute hepatitis A and therefore he may have had a Tylenol injury as well as alcohol related injury.  It looks like he has a severe liver injury and Dr. Piedad Climes felt he was not a transplant candidate based on his history and alcohol use.  There is no bed at [facility] and he was accepted for transfer here.  He recommended checking lactic acid serially.  Last lactic acid was 0.9.  She also recommended to continue empiric antibiotics until further cultures are negative.  And wanted to continue N-acetylcysteine until tomorrow night which has been done.  Due to patient's serious nature of his disease he was transferred here for GI evaluation.  Unable to transfer to [facility] due to no beds due to COVID.  The patient is tearful talking about his life insurance policy on his 5 daughters.  He is complaining of a headache.  And asks for something for sleep.  Review of Systems: As per HPI otherwise all other systems reviewed and  negative.   Past Medical History:  Diagnosis Date  . Neck pain     Past Surgical History:  Procedure Laterality Date  . ABDOMINAL SURGERY    . CLEFT PALATE REPAIR    . EXTERNAL FIXATION LEG Left 11/30/2016   Procedure: APPLICATION EXTERNAL FIXATION LEFT  LEG;  Surgeon: Toni ArthursHewitt, John, MD;  Location: [facility] OR;  Service: Orthopedics;  Laterality: Left;  . EXTERNAL FIXATION REMOVAL Left 12/08/2016    Procedure: REMOVAL OF EXTERNAL FIXATION LEFT ANKLE;  Surgeon: Toni ArthursHewitt, John, MD;  Location: [facility];  Service: Orthopedics;  Laterality: Left;  . I&D EXTREMITY Left 11/30/2016   Procedure: IRRIGATION AND DEBRIDEMENT LEFT ANKLE;  Surgeon: Toni ArthursHewitt, John, MD;  Location: [facility] OR;  Service: Orthopedics;  Laterality: Left;  . I&D EXTREMITY Left 12/08/2016   Procedure: IRRIGATION AND DEBRIDEMENT OPEN FRACTURE LEFT ANKLE;  Surgeon: Toni ArthursHewitt, John, MD;  Location: [facility];  Service: Orthopedics;  Laterality: Left;  . NECK SURGERY    . ORIF FIBULA FRACTURE Left 12/08/2016   Procedure: OPEN REDUCTION INTERNAL FIXATION (ORIF) LEFT PILON AND FIBULA FRACTURE;  Surgeon: Toni ArthursHewitt, John, MD;  Location: [facility];  Service: Orthopedics;  Laterality: Left;    Social History   Social History Narrative   ** Merged History Encounter **    Divorced    5 daughters     reports that he has been smoking cigarettes. He has been smoking about 1.00 pack per day. He has quit using smokeless tobacco.  His smokeless tobacco use included snuff. He reports current alcohol use of about 126.0 standard drinks of alcohol per week. He reports current drug use. Drugs: Marijuana and Benzodiazepines.  No Known Allergies  Family History  Problem Relation Age of Onset  . Heart disease Mother   . Heart disease Father      Prior to Admission medications   Medication Sig Start Date End Date Taking? Authorizing Provider  docusate sodium (COLACE) 100 MG capsule Take 100 mg by mouth daily as needed for mild constipation.    [provider]  enoxaparin (LOVENOX) 40 MG/0.4ML injection Inject 0.4 mLs (40 mg total) into the skin daily. 12/01/16   Jacinta Shoellis, Justin Pike, PA-C  senna (SENOKOT) 8.6 MG TABS tablet Take 2 tablets (17.2 mg total) by mouth 2 (two) times daily. 12/08/16   Jacinta Shoellis, Justin Pike, PA-C    Physical Exam:  Constitutional: Ill-appearing apparent temperature of 103.  Although  his skin feels cooler than that.  Agitated and tearful Vitals:   11/28/18 1813  BP: (!) 157/102  Pulse: (!) 117  Resp: 19  Temp: (!) 103.1 F (39.5 C)  TempSrc: Oral   Eyes: PERRL, lids and conjunctivae normal ENMT: Mucous membranes are moist. Posterior pharynx clear of any exudate or lesions.Normal dentition.  Neck: normal, supple, no masses, no thyromegaly Respiratory: clear to auscultation bilaterally, no wheezing, no crackles. Normal respiratory effort. No accessory muscle use.  Cardiovascular: Regular rate and rhythm, no murmurs / rubs / gallops. No extremity edema. 2+ pedal pulses. No carotid bruits.  Abdomen: no tenderness, no masses palpated. No hepatosplenomegaly. Bowel sounds positive.  Musculoskeletal: no clubbing / cyanosis. No joint deformity upper and lower extremities. Good ROM, no contractures. Normal muscle tone.  Skin: no rashes, lesions, ulcers. No induration Neurologic: CN 2-12 grossly intact. Sensation intact, DTR normal. Strength 5/5 in all 4.  Psychiatric: Normal judgment and insight. Alert and oriented  x 3. Normal mood.    Radiological Exams on Admission: No results found.    Assessment/Plan Principal Problem:   Acute liver failure without hepatic coma Active Problems:   Acute hepatitis A virus infection   Fever   Alcoholic hepatitis without ascites   Alcoholism (HCC)   Thrombocytopenia (HCC)   Drug use   Mild major depression ([place])   Fulminant hepatitis    1 acute liver failure without hepatic coma, fulminant hepatitis:.  Patient's LFTs continue to worsen and he is transferred here for further evaluation by GI.  His hepatitis studies are positive for hepatitis A IgM.  Case is further complicated by regular Tylenol use as well as excessive and heavy alcohol use and PRN benzodiazepines noted in his urine.  Given lactic acids are elevated we will can check serial lactic acid.  Repeat CMP, CBC, and coags ordered for the morning.  Etiologies include but  are not limited to acute hepatitis a virus infection, Tylenol overdose, and acute alcoholic hepatitis.  2.  Fever: Initially 104 at [facility] now 103 here.  I have ordered ibuprofen 600 mg p.o. every 4 hours as needed temperature.  CT scan of the abdomen and pelvis was performed at [facility] without acute findings.  Starting cefepime, metronidazole, and vancomycin to cover for fever of unknown source although I believe that his source is his acute hepatitis A and possibly alcohol withdrawal.  Cultures were sent and are pending at [facility] would recommend checking those results.  Procalcitonin at [facility] was 1.14.  Sepsis protocol ordered although I do not think patient has acute sepsis at this point.  3.  Diarrhea: Likely due to acute hepatitis A infection.  C. difficile for the stool was ordered.  That is pending at [facility].  We will continue IV fluids gently because I am concerned about volume overload and fulminant hepatitis.  Therefore I am going to give him normal saline at 75 mL an hour for 48 hours.  CT scan of the abdomen and pelvis was unremarkable the only finding was a complicated cyst in the anterior spleen.  4.  Alcohol abuse at high risk for alcohol withdrawal: Patient admits to drinking a sixpack a day beer daily for at least 3 days however his ex-wife says he drinks all day long past midnight starting early in the morning.  He also has been drinking liquor.  He reported to psychiatry that he was depressed and that is why he was using alcohol.  Thiamine, folate, multivitamin have been ordered.  I have ordered Ativan per the CIWA protocol.  Proton pump inhibitor.  5.  Thrombocytopenia: Dropped down to 11,000 at [facility].  Suspect that it will stay low for a few days and start to rise once patient has metabolized his alcohol.  6.  Major depression mild: Likely related to substance abuse causing depression.  Seen by psychiatry at [facility].  We will continue  to monitor and provide supportive care.  7.  Alcoholism, prescription drug use, and recent cocaine use: Patient has used cocaine once prior to admission at [facility].  Otherwise he uses benzodiazepines, marijuana and drinks alcohol as much as possible.  Would certainly benefit from a rehab program although I am not sure whether he is excepting of his diagnosis at this point.  DVT prophylaxis: Lovenox Code Status: Full code Family Communication: Patient is his own decision maker Disposition Plan: Likely home in 1 week Consults called: GI spoke to Dr. Ardis Hughs who is on-call for  Dr. Meridee ScoreMansouraty Admission status: Admit - It is my clinical opinion that admission to INPATIENT is reasonable and necessary because of the expectation that this patient will require hospital care that crosses at least 2 midnights to treat this condition based on the medical complexity of the problems presented.  Given the aforementioned information, the predictability of an adverse outcome is felt to be significant.  Patient with very high fever requiring further evaluation as well as fulminant hepatitis and concern for significant risks including but not limited to massive GI bleeding, seizures from alcohol withdrawal and possible sepsis.    Lahoma Crockerheresa C Milaya Hora MD FACP [facility] Pager [phone]- [phone]  How to contact the [facility] Attending or Consulting provider 7A - 7P or covering provider during after hours 7P -7A, for this patient?  1. Check the care team in [facility] and look for a) attending/consulting [facility] provider listed and b) the [facility] team listed 2. Log into [email or web address] and use [place]'s universal password to access. If you do not have the password, please contact the hospital operator. 3. Locate the [facility] provider you are looking for under [facility] and page to a number that you can be directly reached. 4. If you still have difficulty reaching the provider, please page the [facility] (Director on Call) for the  Hospitalists listed on amion for assistance.  If 7PM-7AM, please contact night-coverage [email or web address] Password [email or web address]  11/28/2018, 7:09 PM

## 2018-11-28 NOTE — Progress Notes (Signed)
Pharmacy Antibiotic Note  Jeffrey Braun is a 33 y.o. male admitted on 11/28/2018 with liver failure/fever.  Pharmacy has been consulted for vancomycin and cefepime dosing.  Pharmacy also asked to assist with NAC dosing for possible late tylenol overdose/acute liver injury.  Patient febrile (103), wbc trending down at 3.3. Patient was given cefepime at OSH, will continue here. Vancomycin load ordered. Covid screen negative x 2.   Vancomycin 1500 mg IV Q 8 hrs. Goal AUC 400-550. Expected AUC: 495 SCr used: 0.8  Tylenol level from this morning was negative x 2. AST/ALT have dramatically risen >10k/>6k, INR was 1.5>1.9, Tbili 1.7.   Opened case with poison control, they also recommended continuing NAC until LFT's trend down. Will continue with 15mg /kg/hr dosing and recheck labs and touch base with them again tomorrow night.   Plan: Vancomycin 2g IV now then 1500mg  IV q8 hours Cefepime 2g IV q8 hours NAC 15mg /kg/hr for another 24 hours     Temp (24hrs), Avg:103.1 F (39.5 C), Min:103.1 F (39.5 C), Max:103.1 F (39.5 C)  Recent Labs  Lab 11/28/18 1921 11/28/18 1928  WBC PENDING  --   LATICACIDVEN  --  1.2    CrCl cannot be calculated (Patient's most recent lab result is older than the maximum 21 days allowed.).    No Known Allergies   Thank you for allowing pharmacy to be a part of this patient's care.  Erin Hearing PharmD., BCPS Clinical Pharmacist 11/28/2018 8:19 PM

## 2018-11-29 ENCOUNTER — Encounter (HOSPITAL_COMMUNITY): Payer: Self-pay | Admitting: General Practice

## 2018-11-29 DIAGNOSIS — K7200 Acute and subacute hepatic failure without coma: Secondary | ICD-10-CM

## 2018-11-29 DIAGNOSIS — B159 Hepatitis A without hepatic coma: Secondary | ICD-10-CM

## 2018-11-29 LAB — COMPREHENSIVE METABOLIC PANEL
ALT: 6964 U/L — ABNORMAL HIGH (ref 0–44)
ALT: 6783 U/L — ABNORMAL HIGH (ref 0–44)
AST: >10000 U/L — ABNORMAL HIGH (ref 15–41)
AST: >10000 U/L — ABNORMAL HIGH (ref 15–41)
Albumin: 3.1 g/dL — ABNORMAL LOW (ref 3.5–5.0)
Albumin: 2.8 g/dL — ABNORMAL LOW (ref 3.5–5.0)
Alkaline Phosphatase: 122 U/L (ref 38–126)
Alkaline Phosphatase: 125 U/L (ref 38–126)
Anion gap: 11 (ref 5–15)
Anion gap: 12 (ref 5–15)
BUN: 8 mg/dL (ref 6–20)
BUN: 10 mg/dL (ref 6–20)
CO2: 22 mmol/L (ref 22–32)
CO2: 19 mmol/L — ABNORMAL LOW (ref 22–32)
Calcium: 8 mg/dL — ABNORMAL LOW (ref 8.9–10.3)
Calcium: 8 mg/dL — ABNORMAL LOW (ref 8.9–10.3)
Chloride: 100 mmol/L (ref 98–111)
Chloride: 101 mmol/L (ref 98–111)
Creatinine, Ser: 0.88 mg/dL (ref 0.61–1.24)
Creatinine, Ser: 1.16 mg/dL (ref 0.61–1.24)
GFR calc Af Amer: >60 mL/min (ref >60)
GFR calc Af Amer: >60 mL/min (ref >60)
GFR calc non Af Amer: >60 mL/min (ref >60)
GFR calc non Af Amer: >60 mL/min (ref >60)
Glucose, Bld: 96 mg/dL (ref 70–99)
Glucose, Bld: 106 mg/dL — ABNORMAL HIGH (ref 70–99)
Potassium: 3.5 mmol/L (ref 3.5–5.1)
Potassium: 3.5 mmol/L (ref 3.5–5.1)
Sodium: 133 mmol/L — ABNORMAL LOW (ref 135–145)
Sodium: 132 mmol/L — ABNORMAL LOW (ref 135–145)
Total Bilirubin: 3.2 mg/dL — ABNORMAL HIGH (ref 0.3–1.2)
Total Bilirubin: 3.4 mg/dL — ABNORMAL HIGH (ref 0.3–1.2)
Total Protein: 5.6 g/dL — ABNORMAL LOW (ref 6.5–8.1)
Total Protein: 5.6 g/dL — ABNORMAL LOW (ref 6.5–8.1)

## 2018-11-29 LAB — LACTIC ACID, PLASMA
Lactic Acid, Venous: 1.2 mmol/L (ref 0.5–1.9)
Lactic Acid, Venous: 1.3 mmol/L (ref 0.5–1.9)
Lactic Acid, Venous: 1.4 mmol/L (ref 0.5–1.9)
Lactic Acid, Venous: 1.4 mmol/L (ref 0.5–1.9)

## 2018-11-29 LAB — PROTIME-INR
INR: 1.9 — ABNORMAL HIGH (ref 0.8–1.2)
Prothrombin Time: 21.7 seconds — ABNORMAL HIGH (ref 11.4–15.2)

## 2018-11-29 LAB — CBC
HCT: 39.6 % (ref 39.0–52.0)
Hemoglobin: 14.5 g/dL (ref 13.0–17.0)
MCH: 33.4 pg (ref 26.0–34.0)
MCHC: 36.6 g/dL — ABNORMAL HIGH (ref 30.0–36.0)
MCV: 91.2 fL (ref 80.0–100.0)
Platelets: 90 10*3/uL — ABNORMAL LOW (ref 150–400)
RBC: 4.34 MIL/uL (ref 4.22–5.81)
RDW: 12.5 % (ref 11.5–15.5)
WBC: 3.2 10*3/uL — ABNORMAL LOW (ref 4.0–10.5)
nRBC: 0 % (ref 0.0–0.2)

## 2018-11-29 LAB — APTT: aPTT: 58 seconds — ABNORMAL HIGH (ref 24–36)

## 2018-11-29 LAB — ACETAMINOPHEN LEVEL: Acetaminophen (Tylenol), Serum: <10 ug/mL — ABNORMAL LOW (ref 10–30)

## 2018-11-29 LAB — HIV ANTIBODY (ROUTINE TESTING W REFLEX): HIV Screen 4th Generation wRfx: NONREACTIVE

## 2018-11-29 MED ORDER — DEXTROSE 5 % IV SOLN
15.0000 mg/kg/h | INTRAVENOUS | Status: AC
  Administered 2018-11-30: 15 mg/kg/h via INTRAVENOUS
  Filled 2018-11-29 (×3): qty 120

## 2018-11-29 MED ORDER — LORAZEPAM 0.5 MG PO TABS
0.5000 mg | ORAL_TABLET | Freq: Four times a day (QID) | ORAL | Status: DC | PRN
  Administered 2018-11-29 – 2018-12-03 (×5): 0.5 mg via ORAL
  Filled 2018-11-29 (×5): qty 1

## 2018-11-29 MED ORDER — VITAMIN K1 10 MG/ML IJ SOLN
5.0000 mg | Freq: Every day | INTRAVENOUS | Status: AC
  Administered 2018-11-29 – 2018-12-01 (×3): 5 mg via INTRAVENOUS
  Filled 2018-11-29 (×3): qty 0.5

## 2018-11-29 NOTE — Progress Notes (Signed)
PROGRESS NOTE    Jeffrey Braun  OJJ:[id number] DOB: April 20, 1985 DOA: 11/28/2018 PCP: Patient, No Pcp Per    Brief Narrative:  33 year old gentleman with alcohol abuse, patient reported sixpack of beer a day, spouse reported drinking all day, last use 4 days ago, intermittent cocaine use and last snorted 5 days ago, occasionally pops pain medications went to [facility] with fever, chills, headaches, ringing sensation in the ear for psych eval.  In the emergency room head CT was negative.  COVID-19 negative.  CT scan of the abdomen pelvis normal.  Ultrasound within normal limits.  Viral hepatitis panel positive for hepatitis A IgM antibody. He was admitted to [facility] overnight, LFTs elevated so sent to [place]:   Principal Problem:   Acute liver failure without hepatic coma Active Problems:   Acute hepatitis A virus infection   Alcoholism ([place])   Drug use   Fever   Thrombocytopenia (HCC)   Mild major depression (HCC)   Alcoholic hepatitis without ascites   Fulminant hepatitis  Acute liver failure/fulminant hepatitis with worsening liver functions: AST       10,750 -- > 10,000-- > 10,000 ALT        5045 --- 9371--- 6964 Total bili, 1.7 --2.8--3.2 INR,         1.5 --1.9-1.9 Alcohol level less than 10, Tylenol level less than 10, cocaine negative on presentation UDS. Abdominal ultrasound negative. Serial lactic acid normal. Hepatitis A IgM positive. Hepatitis B and C negative. Suspect primarily due to hepatitis A with underlying chronic alcoholism. Currently no evidence of hepatic encephalopathy. Continue on N-acetylcysteine, repeat Tylenol level before discontinuing. Appreciate gastroenterology input. Very close monitoring including frequent LFTs, INR, lactic acid. Patient is not likely a transplant candidate because of ongoing alcohol use and drug use. Avoid all hepatotoxic medications and Tylenol. Complains of ongoing  headache, will use small dose of Ativan and very small dose of Dilaudid and avoid NSAID's.  Alcohol and drug use: Counseled to quit.  Patient is motivated.  Currently no evidence of withdrawal.  On Ativan as needed for anxiety and headache.  Sepsis: Suspected present on admission.  Started on broad-spectrum antibiotic.  Procalcitonin is normal.  Probably due to 1.  Lactic acid is normal.  Blood cultures negative so far.  Will discontinue antibiotics and monitor clinically.  DVT prophylaxis: Lovenox subcu Code Status: Full code Family Communication: Wife Tavaras Goody Disposition Plan: Unknown at this time   Consultants:   Gastroenterology  Procedures:   None  Antimicrobials:   Vancomycin, cefepime, Flagyl, 11/28/2018--11/29/2018   Subjective: Patient seen and examined.  He complains of global headache, ringing sensation.  Denies any nausea vomiting.  Diarrhea has stopped.  Objective: Vitals:   11/29/18 0446 11/29/18 0526 11/29/18 0806 11/29/18 1154  BP:  132/78 139/78 125/78  Pulse:  (!) 103 (!) 101 89  Resp:  14 16 18   Temp:  100 F (37.8 C) 99.4 F (37.4 C) 98.6 F (37 C)  TempSrc:  Oral Oral Oral  SpO2:  100% 97% 99%  Weight: 90 kg     Height:        Intake/Output Summary (Last 24 hours) at 11/29/2018 1512 Last data filed at 11/29/2018 1100 Gross per 24 hour  Intake 500 ml  Output 700 ml  Net -200 ml   Filed Weights   11/28/18 2042 11/29/18 0446  Weight: 89.8 kg 90 kg    Examination:  General exam: Appears calm and  comfortable, in mild distress and anxious. Respiratory system: Clear to auscultation. Respiratory effort normal. Cardiovascular system: S1 & S2 heard, RRR. No JVD, murmurs, rubs, gallops or clicks. No pedal edema. Gastrointestinal system: Abdomen is nondistended, soft and nontender. No organomegaly or masses felt. Normal bowel sounds heard.  No right upper quadrant tenderness.  No organomegaly. Central nervous system: Alert and oriented. No  focal neurological deficits. No asterixis or flapping tremor. Extremities: Symmetric 5 x 5 power. Skin: No rashes, lesions or ulcers Psychiatry: Judgement and insight appear normal. Mood & affect anxious.    Data Reviewed: I have personally reviewed following labs and imaging studies  CBC: Recent Labs  Lab 11/28/18 1921 11/29/18 0114  WBC 3.3* 3.2*  NEUTROABS 2.2  --   HGB 14.7 14.5  HCT 40.5 39.6  MCV 91.4 91.2  PLT 94* 90*   Basic Metabolic Panel: Recent Labs  Lab 11/28/18 1921 11/29/18 0114  NA 130* 133*  K 3.4* 3.5  CL 98 100  CO2 21* 22  GLUCOSE 108* 96  BUN 9 8  CREATININE 0.87 0.88  CALCIUM 7.9* 8.0*   GFR: Estimated Creatinine Clearance: 134.8 mL/min (by C-G formula based on SCr of 0.88 mg/dL). Liver Function Tests: Recent Labs  Lab 11/28/18 1921 11/29/18 0114  AST >10,000* >10,000*  ALT 6,967* 6,964*  ALKPHOS 117 122  BILITOT 2.8* 3.2*  PROT 5.8* 5.6*  ALBUMIN 3.2* 3.1*   No results for input(s): LIPASE, AMYLASE in the last 168 hours. No results for input(s): AMMONIA in the last 168 hours. Coagulation Profile: Recent Labs  Lab 11/28/18 1921 11/29/18 0114  INR 1.9* 1.9*   Cardiac Enzymes: No results for input(s): CKTOTAL, CKMB, CKMBINDEX, TROPONINI in the last 168 hours. BNP (last 3 results) No results for input(s): PROBNP in the last 8760 hours. HbA1C: No results for input(s): HGBA1C in the last 72 hours. CBG: No results for input(s): GLUCAP in the last 168 hours. Lipid Profile: No results for input(s): CHOL, HDL, LDLCALC, TRIG, CHOLHDL, LDLDIRECT in the last 72 hours. Thyroid Function Tests: No results for input(s): TSH, T4TOTAL, FREET4, T3FREE, THYROIDAB in the last 72 hours. Anemia Panel: No results for input(s): VITAMINB12, FOLATE, FERRITIN, TIBC, IRON, RETICCTPCT in the last 72 hours. Sepsis Labs: Recent Labs  Lab 11/28/18 1921 11/28/18 1928 11/29/18 0114 11/29/18 0658 11/29/18 1226  PROCALCITON 0.95  --   --   --   --    LATICACIDVEN  --  1.2 1.2 1.3 1.4    Recent Results (from the past 240 hour(s))  Culture, blood (x 2)     Status: None (Preliminary result)   Collection Time: 11/28/18  7:22 PM   Specimen: BLOOD  Result Value Ref Range Status   Specimen Description BLOOD RIGHT ANTECUBITAL  Final   Special Requests   Final    BOTTLES DRAWN AEROBIC AND ANAEROBIC Blood Culture results may not be optimal due to an inadequate volume of blood received in culture bottles   Culture   Final    NO GROWTH < 24 HOURS Performed at [facility], [address], [place], [place] [address]    Report Status PENDING  Incomplete  Culture, blood (x 2)     Status: None (Preliminary result)   Collection Time: 11/28/18  7:28 PM   Specimen: BLOOD LEFT HAND  Result Value Ref Range Status   Specimen Description BLOOD LEFT HAND  Final   Special Requests   Final    BOTTLES DRAWN AEROBIC AND ANAEROBIC Blood  Culture adequate volume   Culture   Final    NO GROWTH < 24 HOURS Performed at [facility], [address] [place], [place] [address]    Report Status PENDING  Incomplete         Radiology Studies: No results found.      Scheduled Meds: . enoxaparin (LOVENOX) injection  40 mg Subcutaneous Q24H  . folic acid  1 mg Oral Daily  . multivitamin with minerals  1 tablet Oral Daily  . sodium chloride flush  3 mL Intravenous Q12H  . thiamine  100 mg Oral Daily   Continuous Infusions: . sodium chloride 50 mL/hr at 11/28/18 2220  . sodium chloride    . acetylcysteine 15 mg/kg/hr (11/28/18 2216)     LOS: 1 day    Time spent: 35 minutes    Dorcas CarrowKuber Ahniyah Giancola, MD [facility] Pager [phone]  If 7PM-7AM, please contact night-coverage [email or web address] Password [email or web address] 11/29/2018, 3:12 PM

## 2018-11-29 NOTE — Progress Notes (Addendum)
Pharmacy Note  RABON SCHOLLE is a 33 y.o. male admitted on 11/28/2018 with liver failure/fever.  Pharmacy also asked to assist with NAC dosing for possible late tylenol overdose/acute liver injury.  APAP level remains negative, AST/ALT still severely elevated. Per poison control, recommend continuing NAC for 24 more hours and following up labs at that point.  Plan: Continue NAC 15mg /kg/hr for another 24 hours  Height: 5\' 10"  (177.8 cm) Weight: 198 lb 6.6 oz (90 kg) IBW/kg (Calculated) : 73  Temp (24hrs), Avg:99.3 F (37.4 C), Min:98.6 F (37 C), Max:101.2 F (38.4 C)  Recent Labs  Lab 11/28/18 1921 11/28/18 1928 11/29/18 0114 11/29/18 0658 11/29/18 1226 11/29/18 1909 11/29/18 2020  WBC 3.3*  --  3.2*  --   --   --   --   CREATININE 0.87  --  0.88  --   --   --  1.16  LATICACIDVEN  --  1.2 1.2 1.3 1.4 1.4  --     Estimated Creatinine Clearance: 102.2 mL/min (by C-G formula based on SCr of 1.16 mg/dL).    No Known Allergies   Thank you for allowing pharmacy to be a part of this patient's care.  Arrie Senate, PharmD, BCPS Clinical Pharmacist [phone] Please check AMION for all [place] numbers 11/29/2018
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
## 2018-11-30 LAB — LACTIC ACID, PLASMA
Lactic Acid, Venous: 1.4 mmol/L (ref 0.5–1.9)
Lactic Acid, Venous: 1.5 mmol/L (ref 0.5–1.9)
Lactic Acid, Venous: 1.6 mmol/L (ref 0.5–1.9)

## 2018-11-30 LAB — COMPREHENSIVE METABOLIC PANEL
ALT: 6787 U/L — ABNORMAL HIGH (ref 0–44)
ALT: 5583 U/L — ABNORMAL HIGH (ref 0–44)
AST: >10000 U/L — ABNORMAL HIGH (ref 15–41)
AST: 9191 U/L — ABNORMAL HIGH (ref 15–41)
Albumin: 2.9 g/dL — ABNORMAL LOW (ref 3.5–5.0)
Albumin: 2.7 g/dL — ABNORMAL LOW (ref 3.5–5.0)
Alkaline Phosphatase: 126 U/L (ref 38–126)
Alkaline Phosphatase: 133 U/L — ABNORMAL HIGH (ref 38–126)
Anion gap: 11 (ref 5–15)
Anion gap: 13 (ref 5–15)
BUN: 9 mg/dL (ref 6–20)
BUN: 10 mg/dL (ref 6–20)
CO2: 21 mmol/L — ABNORMAL LOW (ref 22–32)
CO2: 20 mmol/L — ABNORMAL LOW (ref 22–32)
Calcium: 8.2 mg/dL — ABNORMAL LOW (ref 8.9–10.3)
Calcium: 8.3 mg/dL — ABNORMAL LOW (ref 8.9–10.3)
Chloride: 101 mmol/L (ref 98–111)
Chloride: 103 mmol/L (ref 98–111)
Creatinine, Ser: 1.07 mg/dL (ref 0.61–1.24)
Creatinine, Ser: 1.11 mg/dL (ref 0.61–1.24)
GFR calc Af Amer: >60 mL/min (ref >60)
GFR calc Af Amer: >60 mL/min (ref >60)
GFR calc non Af Amer: >60 mL/min (ref >60)
GFR calc non Af Amer: >60 mL/min (ref >60)
Glucose, Bld: 80 mg/dL (ref 70–99)
Glucose, Bld: 100 mg/dL — ABNORMAL HIGH (ref 70–99)
Potassium: 3.6 mmol/L (ref 3.5–5.1)
Potassium: 3.8 mmol/L (ref 3.5–5.1)
Sodium: 133 mmol/L — ABNORMAL LOW (ref 135–145)
Sodium: 136 mmol/L (ref 135–145)
Total Bilirubin: 4.1 mg/dL — ABNORMAL HIGH (ref 0.3–1.2)
Total Bilirubin: 4.1 mg/dL — ABNORMAL HIGH (ref 0.3–1.2)
Total Protein: 5.6 g/dL — ABNORMAL LOW (ref 6.5–8.1)
Total Protein: 5.3 g/dL — ABNORMAL LOW (ref 6.5–8.1)

## 2018-11-30 LAB — CBC
HCT: 37.4 % — ABNORMAL LOW (ref 39.0–52.0)
Hemoglobin: 13.6 g/dL (ref 13.0–17.0)
MCH: 33.4 pg (ref 26.0–34.0)
MCHC: 36.4 g/dL — ABNORMAL HIGH (ref 30.0–36.0)
MCV: 91.9 fL (ref 80.0–100.0)
Platelets: 92 10*3/uL — ABNORMAL LOW (ref 150–400)
RBC: 4.07 MIL/uL — ABNORMAL LOW (ref 4.22–5.81)
RDW: 12.8 % (ref 11.5–15.5)
WBC: 3.3 10*3/uL — ABNORMAL LOW (ref 4.0–10.5)
nRBC: 0 % (ref 0.0–0.2)

## 2018-11-30 LAB — ACETAMINOPHEN LEVEL: Acetaminophen (Tylenol), Serum: <10 ug/mL — ABNORMAL LOW (ref 10–30)

## 2018-11-30 LAB — PROTIME-INR
INR: 2 — ABNORMAL HIGH (ref 0.8–1.2)
Prothrombin Time: 22.1 seconds — ABNORMAL HIGH (ref 11.4–15.2)

## 2018-11-30 MED ORDER — DEXTROSE 5 % IV SOLN
15.0000 mg/kg/h | INTRAVENOUS | Status: DC
  Administered 2018-11-30: 23:00:00 15 mg/kg/h via INTRAVENOUS
  Filled 2018-11-30 (×2): qty 120

## 2018-11-30 NOTE — Progress Notes (Addendum)
Pharmacy Note  Jeffrey Braun is a 33 y.o. male admitted on 11/28/2018 with liver failure/fever.  Pharmacy also asked to assist with NAC dosing for possible late tylenol overdose/acute liver injury.  APAP level remains negative, AST/ALT still severely elevated but trending down. Per poison control, recommend continuing NAC for 12 more hours and following up labs at that point, if INR <2 then can likely discontinue.  Plan: Continue NAC 15mg /kg/hr for another 12 hours then F/U labs  Height: 5\' 10"  (177.8 cm) Weight: 200 lb 6.4 oz (90.9 kg) IBW/kg (Calculated) : 73  Temp (24hrs), Avg:98.8 F (37.1 C), Min:98.1 F (36.7 C), Max:99.7 F (37.6 C)  Recent Labs  Lab 11/28/18 1921  11/29/18 0114  11/29/18 1226 11/29/18 1909 11/29/18 2020 11/30/18 0156 11/30/18 0159 11/30/18 0633 11/30/18 1258 11/30/18 2105  WBC 3.3*  --  3.2*  --   --   --   --   --  3.3*  --   --   --   CREATININE 0.87  --  0.88  --   --   --  1.16  --  1.07  --   --  1.11  LATICACIDVEN  --    < > 1.2   < > 1.4 1.4  --  1.4  --  1.5 1.6  --    < > = values in this interval not displayed.    Estimated Creatinine Clearance: 107.4 mL/min (by C-G formula based on SCr of 1.11 mg/dL).    No Known Allergies   Thank you for allowing pharmacy to be a part of this patient's care.  Arrie Senate, PharmD, BCPS Clinical Pharmacist [phone] Please check AMION for all [place] numbers 11/30/2018

## 2018-11-30 NOTE — Progress Notes (Signed)
PROGRESS NOTE    Jeffrey Braun  ZOX:[id number] DOB: 11/16/1985 DOA: 11/28/2018 PCP: Patient, No Pcp Per    Brief Narrative:  33 year old gentleman with alcohol abuse, patient reported sixpack of beer a day, spouse reported drinking all day, last use 4 days ago, intermittent cocaine use and last snorted 5 days ago, occasionally pops pain medications went to [facility] with fever, chills, headaches, ringing sensation in the ear for psych eval.  In the emergency room head CT was negative.  COVID-19 negative.  CT scan of the abdomen pelvis normal.  Ultrasound within normal limits.  Viral hepatitis panel positive for hepatitis A IgM antibody. He was admitted to [facility] overnight, LFTs elevated so [facility].   Assessment & Plan:   Principal Problem:   Acute liver failure without hepatic coma Active Problems:   Acute hepatitis A virus infection   Alcoholism (HCC)   Drug use   Fever   Thrombocytopenia (HCC)   Mild major depression (HCC)   Alcoholic hepatitis without ascites   Fulminant hepatitis  Acute liver failure/fulminant hepatitis with worsening liver functions: AST       10,750 -- > 10,000-- > 10,000->10,000  ALT        5045 --- [id number]--- [id number] Total bili, 1.7 --2.8--3.2-4 INR,         1.5 --1.9-1.9-2 Alcohol level less than 10, Tylenol level less than 10, cocaine negative on presentation UDS. Abdominal ultrasound negative. Serial lactic acid normal. Hepatitis A IgM positive. Hepatitis B and C negative. Suspect primarily due to hepatitis A with underlying chronic alcoholism. Currently no evidence of hepatic encephalopathy. Continue on N-acetylcysteine, complete dose tonight.  Vitamin k 5 mg daily for 3 days  Appreciate gastroenterology input. Very close monitoring including frequent LFTs, INR, lactic acid. Patient is not likely a transplant candidate because of ongoing alcohol use and drug use. Avoid all hepatotoxic medications and  Tylenol. Complains of ongoing headache, will use small dose of Ativan and very small dose of Dilaudid and avoid NSAID's.  Alcohol and drug use: Counseled to quit. Patient is motivated.  Currently no evidence of withdrawal.  On Ativan as needed for anxiety and headache.  Sepsis: Suspected present on admission.  Started on broad-spectrum antibiotic.  Procalcitonin is normal.  Probably due to 1.  Lactic acid is normal.  Blood cultures negative so far.  Monitoring off antibiotics.   DVT prophylaxis: SCD Code Status: Full code Family Communication: Wife Clarisse GougeKelly Johann on phone.  Disposition Plan: Unknown at this time   Consultants:   Gastroenterology  Procedures:   None  Antimicrobials:   Vancomycin, cefepime, Flagyl, 11/28/2018--11/29/2018   Subjective: Patient seen and examined.  He complains of global headache, ringing sensation. Poor appetite. He has cola colored urine now.  Has 1 bowel movement that was formed.  He does not have any nausea or vomiting but has extreme poor appetite.+ Objective: Vitals:   11/29/18 2044 11/30/18 0034 11/30/18 0500 11/30/18 0541  BP: (!) 141/80 (!) 143/83  119/83  Pulse: 100 93  98  Resp:      Temp: 99.2 F (37.3 C) 99.1 F (37.3 C)  99.7 F (37.6 C)  TempSrc: Oral Oral  Oral  SpO2: 98% 100%  99%  Weight:   90.9 kg   Height:        Intake/Output Summary (Last 24 hours) at 11/30/2018 1446 Last data filed at 11/30/2018 0904 Gross per 24 hour  Intake 3623.21 ml  Output 3025 ml  Net  598.21 ml   Filed Weights   11/28/18 2042 11/29/18 0446 11/30/18 0500  Weight: 89.8 kg 90 kg 90.9 kg    Examination:  General exam: Appears calm and comfortable, in mild distress and anxious. Respiratory system: Clear to auscultation. Respiratory effort normal. Cardiovascular system: S1 & S2 heard, RRR. No JVD, murmurs, rubs, gallops or clicks. No pedal edema. Gastrointestinal system: Abdomen is nondistended, soft and nontender. No organomegaly or masses  felt. Normal bowel sounds heard.  No right upper quadrant tenderness.  No organomegaly. Central nervous system: Alert and oriented. No focal neurological deficits. No asterixis or flapping tremor. Extremities: Symmetric 5 x 5 power. Skin: No rashes, lesions or ulcers Psychiatry: Judgement and insight appear normal. Mood & affect anxious. Tearful.    Data Reviewed: I have personally reviewed following labs and imaging studies  CBC: Recent Labs  Lab 11/28/18 1921 11/29/18 0114 11/30/18 0159  WBC 3.3* 3.2* 3.3*  NEUTROABS 2.2  --   --   HGB 14.7 14.5 13.6  HCT 40.5 39.6 37.4*  MCV 91.4 91.2 91.9  PLT 94* 90* 92*   Basic Metabolic Panel: Recent Labs  Lab 11/28/18 1921 11/29/18 0114 11/29/18 2020 11/30/18 0159  NA 130* 133* 132* 133*  K 3.4* 3.5 3.5 3.6  CL 98 100 101 101  CO2 21* 22 19* 21*  GLUCOSE 108* 96 106* 80  BUN 9 8 10 9   CREATININE 0.87 0.88 1.16 1.07  CALCIUM 7.9* 8.0* 8.0* 8.2*   GFR: Estimated Creatinine Clearance: 111.4 mL/min (by C-G formula based on SCr of 1.07 mg/dL). Liver Function Tests: Recent Labs  Lab 11/28/18 1921 11/29/18 0114 11/29/18 2020 11/30/18 0159  AST >10,000* >10,000* >10,000* >10,000*  ALT 6,967* 6,964* 6,783* 6,787*  ALKPHOS 117 122 125 126  BILITOT 2.8* 3.2* 3.4* 4.1*  PROT 5.8* 5.6* 5.6* 5.6*  ALBUMIN 3.2* 3.1* 2.8* 2.9*   No results for input(s): LIPASE, AMYLASE in the last 168 hours. No results for input(s): AMMONIA in the last 168 hours. Coagulation Profile: Recent Labs  Lab 11/28/18 1921 11/29/18 0114 11/30/18 0159  INR 1.9* 1.9* 2.0*   Cardiac Enzymes: No results for input(s): CKTOTAL, CKMB, CKMBINDEX, TROPONINI in the last 168 hours. BNP (last 3 results) No results for input(s): PROBNP in the last 8760 hours. HbA1C: No results for input(s): HGBA1C in the last 72 hours. CBG: No results for input(s): GLUCAP in the last 168 hours. Lipid Profile: No results for input(s): CHOL, HDL, LDLCALC, TRIG, CHOLHDL,  LDLDIRECT in the last 72 hours. Thyroid Function Tests: No results for input(s): TSH, T4TOTAL, FREET4, T3FREE, THYROIDAB in the last 72 hours. Anemia Panel: No results for input(s): VITAMINB12, FOLATE, FERRITIN, TIBC, IRON, RETICCTPCT in the last 72 hours. Sepsis Labs: Recent Labs  Lab 11/28/18 1921  11/29/18 1909 11/30/18 0156 11/30/18 0633 11/30/18 1258  PROCALCITON 0.95  --   --   --   --   --   LATICACIDVEN  --    < > 1.4 1.4 1.5 1.6   < > = values in this interval not displayed.    Recent Results (from the past 240 hour(s))  Culture, blood (x 2)     Status: None (Preliminary result)   Collection Time: 11/28/18  7:22 PM   Specimen: BLOOD  Result Value Ref Range Status   Specimen Description BLOOD RIGHT ANTECUBITAL  Final   Special Requests   Final    BOTTLES DRAWN AEROBIC AND ANAEROBIC Blood Culture results may not be optimal due to an  inadequate volume of blood received in culture bottles   Culture   Final    NO GROWTH 2 DAYS Performed at [facility], [place] [address] [place], [place] [address]    Report Status PENDING  Incomplete  Culture, blood (x 2)     Status: None (Preliminary result)   Collection Time: 11/28/18  7:28 PM   Specimen: BLOOD LEFT HAND  Result Value Ref Range Status   Specimen Description BLOOD LEFT HAND  Final   Special Requests   Final    BOTTLES DRAWN AEROBIC AND ANAEROBIC Blood Culture adequate volume   Culture   Final    NO GROWTH 2 DAYS Performed at [facility], [place] [address], [place], [place] [address]    Report Status PENDING  Incomplete         Radiology Studies: No results found.      Scheduled Meds: . enoxaparin (LOVENOX) injection  40 mg Subcutaneous Q24H  . folic acid  1 mg Oral Daily  . multivitamin with minerals  1 tablet Oral Daily  . sodium chloride flush  3 mL Intravenous Q12H  . thiamine  100 mg Oral Daily   Continuous Infusions: . sodium chloride 50 mL/hr at 11/30/18 0630  . sodium chloride    .  acetylcysteine 15 mg/kg/hr (11/30/18 0158)  . phytonadione (VITAMIN K) IV 5 mg (11/30/18 0904)     LOS: 2 days    Time spent: 35 minutes    Barb Merino, MD [facility] Pager [phone]  If 7PM-7AM, please contact night-coverage [email or web address] Password [facility] 11/30/2018, 2:46 PM

## 2018-11-30 NOTE — Progress Notes (Addendum)
Daily Rounding Note  11/30/2018, 10:06 AM  LOS: 2 days   SUBJECTIVE:   Chief complaint: Acute hepatitis A, +/- alcoholic hepatitis/alcoholic liver disease. Remains tired with all over body aches.  Occasional nausea.  Anorexia, not eating much.  Tray at bedside has hardly been touched other than the liquids.  Says he has a lot of worries. Formed, dark brown stool this morning.  This is the first 1 stool he has had in a couple of weeks during which he had loose stools.  OBJECTIVE:         Vital signs in last 24 hours:    Temp:  [98.6 F (37 C)-99.7 F (37.6 C)] 99.7 F (37.6 C) (08/28 0541) Pulse Rate:  [89-100] 98 (08/28 0541) Resp:  [18-20] 20 (08/27 1623) BP: (119-143)/(78-85) 119/83 (08/28 0541) SpO2:  [98 %-100 %] 99 % (08/28 0541) Weight:  [90.9 kg] 90.9 kg (08/28 0500) Last BM Date: 11/30/18 Filed Weights   11/28/18 2042 11/29/18 0446 11/30/18 0500  Weight: 89.8 kg 90 kg 90.9 kg   General: Jaundiced, slightly diaphoretic.  Looks ill but not toxic.  Lethargic/somnolent but arousable. Heart: RRR. Chest: Poor inspiratory effort.  Finally got him to take some deep breaths at which time breath sounds were good but he coughed. Abdomen: Soft.  Nontender.  Bowel sounds hypoactive. Extremities: CCE. Neuro/Psych: No asterixis.  No tremors.  Depressed.  Intake/Output from previous day: 08/27 0701 - 08/28 0700 In: 4243.2 [P.O.:480; I.V.:2563.2; IV Piggyback:1200] Out: 2525 [Urine:2525]  Intake/Output this shift: Total I/O In: 120 [P.O.:120] Out: 500 [Urine:500]  Lab Results: Recent Labs    11/28/18 1921 11/29/18 0114 11/30/18 0159  WBC 3.3* 3.2* 3.3*  HGB 14.7 14.5 13.6  HCT 40.5 39.6 37.4*  PLT 94* 90* 92*   BMET Recent Labs    11/29/18 0114 11/29/18 2020 11/30/18 0159  NA 133* 132* 133*  K 3.5 3.5 3.6  CL 100 101 101  CO2 22 19* 21*  GLUCOSE 96 106* 80  BUN 8 10 9   CREATININE 0.88 1.16 1.07   CALCIUM 8.0* 8.0* 8.2*   LFT Recent Labs    11/29/18 0114 11/29/18 2020 11/30/18 0159  PROT 5.6* 5.6* 5.6*  ALBUMIN 3.1* 2.8* 2.9*  AST >10,000* >10,000* >10,000*  ALT 6,964* 6,783* 6,787*  ALKPHOS 122 125 126  BILITOT 3.2* 3.4* 4.1*   PT/INR Recent Labs    11/29/18 0114 11/30/18 0159  LABPROT 21.7* 22.1*  INR 1.9* 2.0*    Scheduled Meds: . enoxaparin (LOVENOX) injection  40 mg Subcutaneous Q24H  . folic acid  1 mg Oral Daily  . multivitamin with minerals  1 tablet Oral Daily  . sodium chloride flush  3 mL Intravenous Q12H  . thiamine  100 mg Oral Daily   Continuous Infusions: . sodium chloride 50 mL/hr at 11/30/18 0630  . sodium chloride    . acetylcysteine 15 mg/kg/hr (11/30/18 0158)  . phytonadione (VITAMIN K) IV 5 mg (11/30/18 0904)   PRN Meds:.sodium chloride, ibuprofen, LORazepam, ondansetron **OR** ondansetron (ZOFRAN) IV, sodium chloride flush, traZODone   ASSESMENT:   *     Acute hepatitis A with possible superimposed alcoholic hepatitis.  However, LFTs normal on 8/22 and became elevated several days later after staying sober for a few days.  Liver, GB, biliary tree benign on CTAP 8/29.   Non elevated APAP level.   Acetylcysteine continues (started 2215 on 8/26). Todays LFTs nearly identical to yesterday's though T  bili 3.4 >> 4.1.    *     Mild coagulopathy.  Day 2 IV Vit K.   INR 1.9 >> 2.      *     Alcohol, substance abuse.  Depression, anxiety.  Previous rehab.  *    Depression, anxiety.  Social stressors of homelessness, separation from wife.    *    Hyponatremia, noncritical. Slightly improved.    *    Thrombocytopenia, non-critical. Stable.     *    Leukopenia in setting of acute viral illness.      *     Stable splenic cyst.   PLAN   *   Continue supportive care. Awaiting pending labs: ANA, smooth muscle antibody, mitochondrial antibody, HCV quant. cmet this afternoon and daily in a.m. after that.  INR in am.    *     Continue acetylcysteine.   Azucena Freed  11/30/2018, 10:06 AM Phone [phone]    Attending Physician Note   I have taken an interval history, reviewed the chart and examined the patient. I agree with the Advanced Practitioner's note, impression and recommendations.   Acute hepatitis A, with possible underlying alcoholic liver disease, and acute liver failure Polysubstance abuse  Supportive care Complete course of acetylcysteine Trend CMP, CBC, PT/INR daily for now When acetylcysteine is completed, LFTs down trending and PT/INR stable or down trending he should be stable for outpatient mgmt Polysubstance abuse, depression, anxiety  GI will plan to see again on Monday. Please call for questions/problems.    Lucio Edward, MD [facility]

## 2018-12-01 DIAGNOSIS — R630 Anorexia: Secondary | ICD-10-CM

## 2018-12-01 DIAGNOSIS — K7010 Alcoholic hepatitis without ascites: Secondary | ICD-10-CM

## 2018-12-01 DIAGNOSIS — F1020 Alcohol dependence, uncomplicated: Secondary | ICD-10-CM

## 2018-12-01 DIAGNOSIS — F1990 Other psychoactive substance use, unspecified, uncomplicated: Secondary | ICD-10-CM

## 2018-12-01 LAB — COMPREHENSIVE METABOLIC PANEL
ALT: 5024 U/L — ABNORMAL HIGH (ref 0–44)
AST: 8455 U/L — ABNORMAL HIGH (ref 15–41)
Albumin: 2.9 g/dL — ABNORMAL LOW (ref 3.5–5.0)
Alkaline Phosphatase: 156 U/L — ABNORMAL HIGH (ref 38–126)
Anion gap: 11 (ref 5–15)
BUN: 9 mg/dL (ref 6–20)
CO2: 23 mmol/L (ref 22–32)
Calcium: 8.5 mg/dL — ABNORMAL LOW (ref 8.9–10.3)
Chloride: 102 mmol/L (ref 98–111)
Creatinine, Ser: 1.12 mg/dL (ref 0.61–1.24)
GFR calc Af Amer: >60 mL/min (ref >60)
GFR calc non Af Amer: >60 mL/min (ref >60)
Glucose, Bld: 78 mg/dL (ref 70–99)
Potassium: 3.9 mmol/L (ref 3.5–5.1)
Sodium: 136 mmol/L (ref 135–145)
Total Bilirubin: 5.3 mg/dL — ABNORMAL HIGH (ref 0.3–1.2)
Total Protein: 5.6 g/dL — ABNORMAL LOW (ref 6.5–8.1)

## 2018-12-01 LAB — HCV RNA QUANT: HCV Quantitative: NOT DETECTED IU/mL (ref >50)

## 2018-12-01 LAB — PROTIME-INR
INR: 1.6 — ABNORMAL HIGH (ref 0.8–1.2)
Prothrombin Time: 18.7 seconds — ABNORMAL HIGH (ref 11.4–15.2)

## 2018-12-01 LAB — ACETAMINOPHEN LEVEL: Acetaminophen (Tylenol), Serum: <10 ug/mL — ABNORMAL LOW (ref 10–30)

## 2018-12-01 MED ORDER — SODIUM CHLORIDE 0.9 % IV SOLN: 250.0000 mL | INTRAVENOUS | Status: DC | PRN

## 2018-12-01 NOTE — Progress Notes (Signed)
Poison control said we could stop mucomyst iv since AST and INR are trending down.   Barnett Applebaum [phone]

## 2018-12-01 NOTE — Progress Notes (Signed)
PROGRESS NOTE    Jeffrey Braun  ZOX:[id number]  DOB: 02-23-1986  DOA: 11/28/2018 PCP: Patient, No Pcp Per  Brief Narrative:  33 year old male with history of heavy alcohol use (6 pack beer a day or more per spouse), cocaine abuse (last use 5 days prior to admission), opiate abuse presented to [facility] with fever chills, headache and tinnitus.  Work-up revealed significantly elevated LFTs.  CT head/CT abdomen pelvis/ultrasonogram abdomen were essentially negative.  Viral hepatitis panel resulted positive for hepatitis A IgM.  On admission he also reported taking over-the-counter pain medications including NSAIDs/acetaminophen but acetaminophen level was not elevated.  Patient was briefly admitted at [facility].  Given worsening of LFTs (AST is now greater than 10,000 and his ALT is greater than 5200) and elevated INR, case was discussed with GI/hepatology who recommended starting on NAC x21 hours (since bilirubin was not high enough as expected in acute hepatitis A, Tylenol injury was suspected and treated empirically) and transferred to [facility] [facility] [place] after unsuccessful attempt to [facility].  Subjective:  Patient continues to have poor appetite.  Is being followed by GI and transaminases somewhat downtrending now.  Objective: Vitals:   11/30/18 0541 11/30/18 1600 11/30/18 2138 12/01/18 0509  BP: 119/83 124/88 (!) 143/91 107/67  Pulse: 98  91 80  Resp:      Temp: 99.7 F (37.6 C) 98.1 F (36.7 C) 98.2 F (36.8 C) 98.5 F (36.9 C)  TempSrc: Oral Oral Oral Oral  SpO2: 99% 99% 100% 97%  Weight:      Height:        Intake/Output Summary (Last 24 hours) at 12/01/2018 1539 Last data filed at 12/01/2018 0801 Gross per 24 hour  Intake 483 ml  Output -  Net 483 ml   Filed Weights   11/28/18 2042 11/29/18 0446 11/30/18 0500  Weight: 89.8 kg 90 kg 90.9 kg    Physical Examination:  General exam: Appears calm and comfortable  Respiratory  system: Clear to auscultation. Respiratory effort normal. Cardiovascular system: S1 & S2 heard, RRR. No JVD, murmurs, rubs, gallops or clicks. No pedal edema. Gastrointestinal system: Abdomen is nondistended, soft and nontender. No organomegaly or masses felt. Normal bowel sounds heard. Central nervous system: Alert and oriented. No focal neurological deficits. Extremities: Symmetric 5 x 5 power. Skin: No rashes, lesions or ulcers Psychiatry: Judgement and insight appear normal. Mood & affect appropriate.   Data Reviewed: I have personally reviewed following labs and imaging studies  CBC: Recent Labs  Lab 11/28/18 1921 11/29/18 0114 11/30/18 0159  WBC 3.3* 3.2* 3.3*  NEUTROABS 2.2  --   --   HGB 14.7 14.5 13.6  HCT 40.5 39.6 37.4*  MCV 91.4 91.2 91.9  PLT 94* 90* 92*   Basic Metabolic Panel: Recent Labs  Lab 11/29/18 0114 11/29/18 2020 11/30/18 0159 11/30/18 2105 12/01/18 0724  NA 133* 132* 133* 136 136  K 3.5 3.5 3.6 3.8 3.9  CL 100 101 101 103 102  CO2 22 19* 21* 20* 23  GLUCOSE 96 106* 80 100* 78  BUN 8 10 9 10 9   CREATININE 0.88 1.16 1.07 1.11 1.12  CALCIUM 8.0* 8.0* 8.2* 8.3* 8.5*   GFR: Estimated Creatinine Clearance: 106.4 mL/min (by C-G formula based on SCr of 1.12 mg/dL). Liver Function Tests: Recent Labs  Lab 11/29/18 0114 11/29/18 2020 11/30/18 0159 11/30/18 2105 12/01/18 0724  AST >10,000* >10,000* >10,000* 9,191* 8,455*  ALT 6,964* 6,783* 6,787* 5,583* 5,024*  ALKPHOS 122  125 126 133* 156*  BILITOT 3.2* 3.4* 4.1* 4.1* 5.3*  PROT 5.6* 5.6* 5.6* 5.3* 5.6*  ALBUMIN 3.1* 2.8* 2.9* 2.7* 2.9*   No results for input(s): LIPASE, AMYLASE in the last 168 hours. No results for input(s): AMMONIA in the last 168 hours. Coagulation Profile: Recent Labs  Lab 11/28/18 1921 11/29/18 0114 11/30/18 0159 12/01/18 0724  INR 1.9* 1.9* 2.0* 1.6*   Cardiac Enzymes: No results for input(s): CKTOTAL, CKMB, CKMBINDEX, TROPONINI in the last 168 hours. BNP (last  3 results) No results for input(s): PROBNP in the last 8760 hours. HbA1C: No results for input(s): HGBA1C in the last 72 hours. CBG: No results for input(s): GLUCAP in the last 168 hours. Lipid Profile: No results for input(s): CHOL, HDL, LDLCALC, TRIG, CHOLHDL, LDLDIRECT in the last 72 hours. Thyroid Function Tests: No results for input(s): TSH, T4TOTAL, FREET4, T3FREE, THYROIDAB in the last 72 hours. Anemia Panel: No results for input(s): VITAMINB12, FOLATE, FERRITIN, TIBC, IRON, RETICCTPCT in the last 72 hours. Sepsis Labs: Recent Labs  Lab 11/28/18 1921  11/29/18 1909 11/30/18 0156 11/30/18 0633 11/30/18 1258  PROCALCITON 0.95  --   --   --   --   --   LATICACIDVEN  --    < > 1.4 1.4 1.5 1.6   < > = values in this interval not displayed.    Recent Results (from the past 240 hour(s))  Culture, blood (x 2)     Status: None (Preliminary result)   Collection Time: 11/28/18  7:22 PM   Specimen: BLOOD  Result Value Ref Range Status   Specimen Description BLOOD RIGHT ANTECUBITAL  Final   Special Requests   Final    BOTTLES DRAWN AEROBIC AND ANAEROBIC Blood Culture results may not be optimal due to an inadequate volume of blood received in culture bottles   Culture   Final    NO GROWTH 3 DAYS Performed at [facility], [place] [address], [place], [place] [address]    Report Status PENDING  Incomplete  Culture, blood (x 2)     Status: None (Preliminary result)   Collection Time: 11/28/18  7:28 PM   Specimen: BLOOD LEFT HAND  Result Value Ref Range Status   Specimen Description BLOOD LEFT HAND  Final   Special Requests   Final    BOTTLES DRAWN AEROBIC AND ANAEROBIC Blood Culture adequate volume   Culture   Final    NO GROWTH 3 DAYS Performed at [facility], [place] [address], [place], [place] [address]    Report Status PENDING  Incomplete      Radiology Studies: No results found.      Scheduled Meds: . enoxaparin (LOVENOX) injection  40 mg Subcutaneous  Q24H  . folic acid  1 mg Oral Daily  . multivitamin with minerals  1 tablet Oral Daily  . sodium chloride flush  3 mL Intravenous Q12H  . thiamine  100 mg Oral Daily   Continuous Infusions: . sodium chloride    . acetylcysteine 15 mg/kg/hr (11/30/18 2259)    Assessment & Plan:    1.  Acute liver failure: Transaminases appear to be downtrending (AST/ALT) are although alkaline phosphatase/total bili slightly uptrending.  INR improved from 2 -->1.6. Patient received IV vitamin K x3 doses.  Patient does not have any stigmata of ascites or edema.  He reports poor appetite and eating less than 30% of his meals.  Resume IV hydration.  Can likely come off NAC today.  Avoid acetaminophen and other hepatotoxins.  Check ammonia level given complaints of weakness/poor appetite.  2.  Diarrhea/fever: Present on admission.  Likely related to hepatitis A infection.  Now resolved.  Patient denies recent travel, eating outside or sick contacts.  Avoid acetaminophen, utilizing Advil as needed  3.  Alcohol abuse/dependence: No signs of withdrawal currently.  Continue multivitamin/thiamine.  4.  Drug abuse: No signs of withdrawal.  Last use of cocaine 5 days prior to admission.  Patient counseled of risks and advised to quit.  He appears motivated.  Will need outpatient substance abuse center referral.  5.  FEN: Intake remains poor.  Resume IV fluids.  Dietitian consult.  Check ammonia level.   DVT prophylaxis: Lovenox Code Status: Full code Family / Patient Communication: Discussed with patient and all questions answered to satisfaction. Disposition Plan: Home when medically cleared    LOS: 3 days    Time spent: 35 minutes    Alessandra Bevels, MD [facility] Pager [phone]  If 7PM-7AM, please contact night-coverage [email or web address] Password [facility] 12/01/2018, 3:39 PM

## 2018-12-01 NOTE — Progress Notes (Signed)
I paged ID to ask if pt needs to be on precautions for Hep A.

## 2018-12-02 ENCOUNTER — Other Ambulatory Visit: Payer: Self-pay

## 2018-12-02 LAB — COMPREHENSIVE METABOLIC PANEL
ALT: 4236 U/L — ABNORMAL HIGH (ref 0–44)
AST: 6158 U/L — ABNORMAL HIGH (ref 15–41)
Albumin: 2.9 g/dL — ABNORMAL LOW (ref 3.5–5.0)
Alkaline Phosphatase: 153 U/L — ABNORMAL HIGH (ref 38–126)
Anion gap: 9 (ref 5–15)
BUN: 9 mg/dL (ref 6–20)
CO2: 25 mmol/L (ref 22–32)
Calcium: 8.7 mg/dL — ABNORMAL LOW (ref 8.9–10.3)
Chloride: 102 mmol/L (ref 98–111)
Creatinine, Ser: 1.02 mg/dL (ref 0.61–1.24)
GFR calc Af Amer: >60 mL/min (ref >60)
GFR calc non Af Amer: >60 mL/min (ref >60)
Glucose, Bld: 63 mg/dL — ABNORMAL LOW (ref 70–99)
Potassium: 3.8 mmol/L (ref 3.5–5.1)
Sodium: 136 mmol/L (ref 135–145)
Total Bilirubin: 5.2 mg/dL — ABNORMAL HIGH (ref 0.3–1.2)
Total Protein: 5.8 g/dL — ABNORMAL LOW (ref 6.5–8.1)

## 2018-12-02 LAB — PROTIME-INR
INR: 1.3 — ABNORMAL HIGH (ref 0.8–1.2)
Prothrombin Time: 16.3 seconds — ABNORMAL HIGH (ref 11.4–15.2)

## 2018-12-02 LAB — AMMONIA: Ammonia: 50 umol/L — ABNORMAL HIGH (ref 9–35)

## 2018-12-02 MED ORDER — LACTULOSE 10 GM/15ML PO SOLN
10.0000 g | Freq: Every day | ORAL | Status: DC
  Administered 2018-12-02 – 2018-12-04 (×3): 10 g via ORAL
  Filled 2018-12-02 (×3): qty 15

## 2018-12-02 NOTE — Progress Notes (Signed)
PROGRESS NOTE    Jeffrey Braun  ZOX:[id number]  DOB: [birth year]  DOA: 11/28/2018 PCP: Patient, No Pcp Per  Brief Narrative:  33 year old male with history of heavy alcohol use (6 pack beer a day or more per spouse), cocaine abuse (last use 5 days prior to admission), opiate abuse presented to [facility] with fever chills, headache and tinnitus.  Work-up revealed significantly elevated LFTs.  CT head/CT abdomen pelvis/ultrasonogram abdomen were essentially negative.  Viral hepatitis panel resulted positive for hepatitis A IgM.  On admission he also reported taking over-the-counter pain medications including NSAIDs/acetaminophen but acetaminophen level was not elevated.  Patient was briefly admitted at [facility].  Given worsening of LFTs (AST is now greater than 10,000 and his ALT is greater than 5200) and elevated INR, case was discussed with GI/hepatology who recommended starting on NAC x21 hours (since bilirubin was not high enough as expected in acute hepatitis A, Tylenol injury was suspected and treated empirically) and transferred to [facility]  after unsuccessful attempt to transfer to [facility].  Subjective:  Patient reports eating 50% dinner last night. Did not have any breakfast and was still asleep during rounds.  Is being followed by GI and transaminases downtrending now.  Objective: Vitals:   12/01/18 1500 12/01/18 2116 12/02/18 0628 12/02/18 1214  BP: 140/89 (!) 129/96 111/82 115/87  Pulse: 90 (!) 101 85 95  Resp: 19 16 16 16   Temp: 98 F (36.7 C) (!) 97.3 F (36.3 C) 98 F (36.7 C) 97.6 F (36.4 C)  TempSrc: Oral Oral Oral Oral  SpO2: 100% 99% 98% 100%  Weight:      Height:        Intake/Output Summary (Last 24 hours) at 12/02/2018 1715 Last data filed at 12/02/2018 0000 Gross per 24 hour  Intake 240 ml  Output -  Net 240 ml   Filed Weights   11/28/18 2042 11/29/18 0446 11/30/18 0500  Weight: 89.8 kg 90 kg 90.9 kg    Physical  Examination:  General exam: Appears calm and comfortable  Respiratory system: Clear to auscultation. Respiratory effort normal. Cardiovascular system: S1 & S2 heard, RRR. No JVD, murmurs, rubs, gallops or clicks. No pedal edema. Gastrointestinal system: Abdomen is nondistended, soft and nontender. No organomegaly or masses felt. Normal bowel sounds heard. Central nervous system: Alert and oriented. No focal neurological deficits. Extremities: Symmetric 5 x 5 power. Skin: No rashes, lesions or ulcers Psychiatry: Judgement and insight appear normal. Mood & affect appropriate.   Data Reviewed: I have personally reviewed following labs and imaging studies  CBC: Recent Labs  Lab 11/28/18 1921 11/29/18 0114 11/30/18 0159  WBC 3.3* 3.2* 3.3*  NEUTROABS 2.2  --   --   HGB 14.7 14.5 13.6  HCT 40.5 39.6 37.4*  MCV 91.4 91.2 91.9  PLT 94* 90* 92*   Basic Metabolic Panel: Recent Labs  Lab 11/29/18 2020 11/30/18 0159 11/30/18 2105 12/01/18 0724 12/02/18 0343  NA 132* 133* 136 136 136  K 3.5 3.6 3.8 3.9 3.8  CL 101 101 103 102 102  CO2 19* 21* 20* 23 25  GLUCOSE 106* 80 100* 78 63*  BUN 10 9 10 9 9   CREATININE 1.16 1.07 1.11 1.12 1.02  CALCIUM 8.0* 8.2* 8.3* 8.5* 8.7*   GFR: Estimated Creatinine Clearance: 116.8 mL/min (by C-G formula based on SCr of 1.02 mg/dL). Liver Function Tests: Recent Labs  Lab 11/29/18 2020 11/30/18 0159 11/30/18 2105 12/01/18 0724 12/02/18 0343  AST >10,000* >10,000*  9,191* 8,455* 6,158*  ALT 6,783* 6,787* 5,583* 5,024* 4,236*  ALKPHOS 125 126 133* 156* 153*  BILITOT 3.4* 4.1* 4.1* 5.3* 5.2*  PROT 5.6* 5.6* 5.3* 5.6* 5.8*  ALBUMIN 2.8* 2.9* 2.7* 2.9* 2.9*   No results for input(s): LIPASE, AMYLASE in the last 168 hours. Recent Labs  Lab 12/02/18 0343  AMMONIA 50*   Coagulation Profile: Recent Labs  Lab 11/28/18 1921 11/29/18 0114 11/30/18 0159 12/01/18 0724 12/02/18 0343  INR 1.9* 1.9* 2.0* 1.6* 1.3*   Cardiac Enzymes: No  results for input(s): CKTOTAL, CKMB, CKMBINDEX, TROPONINI in the last 168 hours. BNP (last 3 results) No results for input(s): PROBNP in the last 8760 hours. HbA1C: No results for input(s): HGBA1C in the last 72 hours. CBG: No results for input(s): GLUCAP in the last 168 hours. Lipid Profile: No results for input(s): CHOL, HDL, LDLCALC, TRIG, CHOLHDL, LDLDIRECT in the last 72 hours. Thyroid Function Tests: No results for input(s): TSH, T4TOTAL, FREET4, T3FREE, THYROIDAB in the last 72 hours. Anemia Panel: No results for input(s): VITAMINB12, FOLATE, FERRITIN, TIBC, IRON, RETICCTPCT in the last 72 hours. Sepsis Labs: Recent Labs  Lab 11/28/18 1921  11/29/18 1909 11/30/18 0156 11/30/18 0633 11/30/18 1258  PROCALCITON 0.95  --   --   --   --   --   LATICACIDVEN  --    < > 1.4 1.4 1.5 1.6   < > = values in this interval not displayed.    Recent Results (from the past 240 hour(s))  Culture, blood (x 2)     Status: None (Preliminary result)   Collection Time: 11/28/18  7:22 PM   Specimen: BLOOD  Result Value Ref Range Status   Specimen Description BLOOD RIGHT ANTECUBITAL  Final   Special Requests   Final    BOTTLES DRAWN AEROBIC AND ANAEROBIC Blood Culture results may not be optimal due to an inadequate volume of blood received in culture bottles   Culture   Final    NO GROWTH 4 DAYS Performed at [facility], [place] [address] [place], [place] [address]    Report Status PENDING  Incomplete  Culture, blood (x 2)     Status: None (Preliminary result)   Collection Time: 11/28/18  7:28 PM   Specimen: BLOOD LEFT HAND  Result Value Ref Range Status   Specimen Description BLOOD LEFT HAND  Final   Special Requests   Final    BOTTLES DRAWN AEROBIC AND ANAEROBIC Blood Culture adequate volume   Culture   Final    NO GROWTH 4 DAYS Performed at [facility], [place] [address] [place], [place] [address]    Report Status PENDING  Incomplete      Radiology Studies: No  results found.      Scheduled Meds: . enoxaparin (LOVENOX) injection  40 mg Subcutaneous Q24H  . folic acid  1 mg Oral Daily  . lactulose  10 g Oral Daily  . multivitamin with minerals  1 tablet Oral Daily  . sodium chloride flush  3 mL Intravenous Q12H  . thiamine  100 mg Oral Daily   Continuous Infusions: . sodium chloride      Assessment & Plan:    1.  Acute liver failure: Transaminases appear to be downtrending (AST/ALT) are although alkaline phosphatase/total bili are equivocal.  INR improved from 2 -->1.6-->1.3. Patient received IV vitamin K x3 doses during the hospital course.  Patient does not have any stigmata of ascites or edema.  He reports  poor appetite and eating less than 30% of his meals.  Resume IV hydration. off NAC now as d/w GI.  Avoid acetaminophen and other hepatotoxins.  Checked ammonia level given complaints of weakness/poor appetite/somnolence: somewhat elevated at 50.Ordered low dose lactulose (to avoid exacerbation of #2)  2.  Diarrhea/fever: Present on admission.  Likely related to hepatitis A infection.  Now resolved.  Patient denies recent travel, eating outside or sick contacts.  Avoid acetaminophen, utilizing Advil as needed  3.  Alcohol abuse/dependence: No signs of withdrawal currently.  Continue multivitamin/thiamine.  4.  Drug abuse: No signs of withdrawal.  Last use of cocaine 5 days prior to admission.  Patient counseled of risks and advised to quit.  He appears motivated.  Will need outpatient substance abuse center referral.  5.  Leucopenia/thrombocytopenia: Likely related to #1 and #3. No splenomegaly on CT from 2018, [facility] hospital imagining reportedly WNL for liver/GB/spleen  6. FEN: Intake remains poor.  Resume IV fluids.  Dietitian consult.  Check ammonia level.   DVT prophylaxis: Lovenox Code Status: Full code Family / Patient Communication: Discussed with patient and all questions answered to satisfaction. Disposition Plan: Home  when medically cleared    LOS: 4 days    Time spent: 35 minutes    Alessandra Bevels, MD [facility] Pager [phone]  If 7PM-7AM, please contact night-coverage [email or web address] Password [email or web address] 12/02/2018, 5:15 PM

## 2018-12-03 LAB — CULTURE, BLOOD (ROUTINE X 2)
Culture: NO GROWTH
Culture: NO GROWTH
Special Requests: ADEQUATE

## 2018-12-03 LAB — COMPREHENSIVE METABOLIC PANEL
ALT: 3463 U/L — ABNORMAL HIGH (ref 0–44)
AST: 4433 U/L — ABNORMAL HIGH (ref 15–41)
Albumin: 2.9 g/dL — ABNORMAL LOW (ref 3.5–5.0)
Alkaline Phosphatase: 145 U/L — ABNORMAL HIGH (ref 38–126)
Anion gap: 10 (ref 5–15)
BUN: 8 mg/dL (ref 6–20)
CO2: 26 mmol/L (ref 22–32)
Calcium: 8.7 mg/dL — ABNORMAL LOW (ref 8.9–10.3)
Chloride: 100 mmol/L (ref 98–111)
Creatinine, Ser: 1.15 mg/dL (ref 0.61–1.24)
GFR calc Af Amer: >60 mL/min (ref >60)
GFR calc non Af Amer: >60 mL/min (ref >60)
Glucose, Bld: 85 mg/dL (ref 70–99)
Potassium: 4.1 mmol/L (ref 3.5–5.1)
Sodium: 136 mmol/L (ref 135–145)
Total Bilirubin: 6.1 mg/dL — ABNORMAL HIGH (ref 0.3–1.2)
Total Protein: 5.8 g/dL — ABNORMAL LOW (ref 6.5–8.1)

## 2018-12-03 LAB — MITOCHONDRIAL ANTIBODIES: Mitochondrial M2 Ab, IgG: <20 Units (ref 0.0–20.0)

## 2018-12-03 LAB — PROTIME-INR
INR: 1.1 (ref 0.8–1.2)
Prothrombin Time: 14.5 seconds (ref 11.4–15.2)

## 2018-12-03 LAB — ANTINUCLEAR ANTIBODIES, IFA: ANA Ab, IFA: NEGATIVE

## 2018-12-03 LAB — ANTI-SMOOTH MUSCLE ANTIBODY, IGG: F-Actin IgG: 6 Units (ref 0–19)

## 2018-12-03 MED ORDER — SODIUM CHLORIDE 0.9 % IV SOLN
250.0000 mL | INTRAVENOUS | Status: DC
  Administered 2018-12-03: 250 mL via INTRAVENOUS

## 2018-12-03 MED ORDER — SODIUM CHLORIDE 0.9 % IV SOLN
INTRAVENOUS | Status: DC
  Administered 2018-12-03: 22:00:00 via INTRAVENOUS

## 2018-12-03 MED ORDER — BOOST / RESOURCE BREEZE PO LIQD CUSTOM
1.0000 | Freq: Three times a day (TID) | ORAL | Status: DC
  Administered 2018-12-03 – 2018-12-04 (×2): 1 via ORAL

## 2018-12-03 NOTE — Progress Notes (Signed)
PT Cancellation Note  Patient Details Name: Jeffrey Braun MRN: [id number] DOB: Nov 08, 1985   Cancelled Treatment:    Reason Eval/Treat Not Completed: Other (comment);Fatigue/lethargy limiting ability to participate.  Pt reports two hours of sleep and has taken some Ativan per his report.  Will re-attempt at another time.   Ramond Dial 12/03/2018, 10:05 AM   Mee Hives, PT [facility] Acute Rehab Dept. Number: [place] and [place]

## 2018-12-03 NOTE — Progress Notes (Signed)
PROGRESS NOTE    Jeffrey Braun  GMW:[id number]  DOB: 10/19/1985  DOA: 11/28/2018 PCP: Patient, No Pcp Per  Brief Narrative:  33 year old male with history of heavy alcohol use (6 pack beer a day or more per spouse), cocaine abuse (last use 5 days prior to admission), opiate abuse presented to [facility] with fever chills, headache and tinnitus.  Work-up revealed significantly elevated LFTs.  CT head/CT abdomen pelvis/ultrasonogram abdomen were essentially negative.  Viral hepatitis panel resulted positive for hepatitis A IgM.  On admission he also reported taking over-the-counter pain medications including NSAIDs/acetaminophen but acetaminophen level was not elevated.  Patient was briefly admitted at [facility].  Given worsening of LFTs (AST is now greater than 10,000 and his ALT is greater than 5200) and elevated INR, case was discussed with GI/hepatology who recommended starting on NAC x21 hours (since bilirubin was not high enough as expected in acute hepatitis A, Tylenol injury was suspected and treated empirically) and transferred to [facility] after unsuccessful attempt to transfer to [facility].  Subjective:  Patient reports eating about 50% of his meals. Has 2 semiformed stools with lactulose. No diarrhea.  Is being followed by GI and transaminases downtrending now.  Objective: Vitals:   12/02/18 2231 12/03/18 0210 12/03/18 0500 12/03/18 1131  BP: 109/87 122/89  123/81  Pulse: 80 77  88  Resp:    18  Temp: 98.7 F (37.1 C) 98.5 F (36.9 C)  97.6 F (36.4 C)  TempSrc: Oral Oral  Oral  SpO2: 98% 97%  97%  Weight:   88.1 kg   Height:        Intake/Output Summary (Last 24 hours) at 12/03/2018 1638 Last data filed at 12/03/2018 1500 Gross per 24 hour  Intake 1400 ml  Output -  Net 1400 ml   Filed Weights   11/29/18 0446 11/30/18 0500 12/03/18 0500  Weight: 90 kg 90.9 kg 88.1 kg    Physical Examination:  General exam: Appears calm and  comfortable  Respiratory system: Clear to auscultation. Respiratory effort normal. Cardiovascular system: S1 & S2 heard, RRR. No JVD, murmurs, rubs, gallops or clicks. No pedal edema. Gastrointestinal system: Abdomen is nondistended, soft and nontender. No organomegaly or masses felt. Normal bowel sounds heard. Central nervous system: Alert and oriented. No focal neurological deficits. Extremities: Symmetric 5 x 5 power. Skin: No rashes, lesions or ulcers Psychiatry: Judgement and insight appear normal. Mood & affect appropriate.   Data Reviewed: I have personally reviewed following labs and imaging studies  CBC: Recent Labs  Lab 11/28/18 1921 11/29/18 0114 11/30/18 0159  WBC 3.3* 3.2* 3.3*  NEUTROABS 2.2  --   --   HGB 14.7 14.5 13.6  HCT 40.5 39.6 37.4*  MCV 91.4 91.2 91.9  PLT 94* 90* 92*   Basic Metabolic Panel: Recent Labs  Lab 11/30/18 0159 11/30/18 2105 12/01/18 0724 12/02/18 0343 12/03/18 0315  NA 133* 136 136 136 136  K 3.6 3.8 3.9 3.8 4.1  CL 101 103 102 102 100  CO2 21* 20* 23 25 26   GLUCOSE 80 100* 78 63* 85  BUN 9 10 9 9 8   CREATININE 1.07 1.11 1.12 1.02 1.15  CALCIUM 8.2* 8.3* 8.5* 8.7* 8.7*   GFR: Estimated Creatinine Clearance: 102.1 mL/min (by C-G formula based on SCr of 1.15 mg/dL). Liver Function Tests: Recent Labs  Lab 11/30/18 0159 11/30/18 2105 12/01/18 0724 12/02/18 0343 12/03/18 0315  AST >10,000* 9,191* 8,455* 6,158* 4,433*  ALT 6,787* 5,583*  5,024* 4,236* 3,463*  ALKPHOS 126 133* 156* 153* 145*  BILITOT 4.1* 4.1* 5.3* 5.2* 6.1*  PROT 5.6* 5.3* 5.6* 5.8* 5.8*  ALBUMIN 2.9* 2.7* 2.9* 2.9* 2.9*   No results for input(s): LIPASE, AMYLASE in the last 168 hours. Recent Labs  Lab 12/02/18 0343  AMMONIA 50*   Coagulation Profile: Recent Labs  Lab 11/29/18 0114 11/30/18 0159 12/01/18 0724 12/02/18 0343 12/03/18 0315  INR 1.9* 2.0* 1.6* 1.3* 1.1   Cardiac Enzymes: No results for input(s): CKTOTAL, CKMB, CKMBINDEX, TROPONINI in  the last 168 hours. BNP (last 3 results) No results for input(s): PROBNP in the last 8760 hours. HbA1C: No results for input(s): HGBA1C in the last 72 hours. CBG: No results for input(s): GLUCAP in the last 168 hours. Lipid Profile: No results for input(s): CHOL, HDL, LDLCALC, TRIG, CHOLHDL, LDLDIRECT in the last 72 hours. Thyroid Function Tests: No results for input(s): TSH, T4TOTAL, FREET4, T3FREE, THYROIDAB in the last 72 hours. Anemia Panel: No results for input(s): VITAMINB12, FOLATE, FERRITIN, TIBC, IRON, RETICCTPCT in the last 72 hours. Sepsis Labs: Recent Labs  Lab 11/28/18 1921  11/29/18 1909 11/30/18 0156 11/30/18 0633 11/30/18 1258  PROCALCITON 0.95  --   --   --   --   --   LATICACIDVEN  --    < > 1.4 1.4 1.5 1.6   < > = values in this interval not displayed.    Recent Results (from the past 240 hour(s))  Culture, blood (x 2)     Status: None   Collection Time: 11/28/18  7:22 PM   Specimen: BLOOD  Result Value Ref Range Status   Specimen Description BLOOD RIGHT ANTECUBITAL  Final   Special Requests   Final    BOTTLES DRAWN AEROBIC AND ANAEROBIC Blood Culture results may not be optimal due to an inadequate volume of blood received in culture bottles   Culture   Final    NO GROWTH 5 DAYS Performed at [facility], [address] [place], [place] [address]    Report Status 12/03/2018 FINAL  Final  Culture, blood (x 2)     Status: None   Collection Time: 11/28/18  7:28 PM   Specimen: BLOOD LEFT HAND  Result Value Ref Range Status   Specimen Description BLOOD LEFT HAND  Final   Special Requests   Final    BOTTLES DRAWN AEROBIC AND ANAEROBIC Blood Culture adequate volume   Culture   Final    NO GROWTH 5 DAYS Performed at [facility], [address] [place], [place] [address]    Report Status 12/03/2018 FINAL  Final      Radiology Studies: No results found.      Scheduled Meds: . enoxaparin (LOVENOX) injection  40 mg Subcutaneous Q24H   . feeding supplement  1 Container Oral TID BM  . folic acid  1 mg Oral Daily  . lactulose  10 g Oral Daily  . multivitamin with minerals  1 tablet Oral Daily  . sodium chloride flush  3 mL Intravenous Q12H  . thiamine  100 mg Oral Daily   Continuous Infusions: . sodium chloride      Assessment & Plan:    1.  Acute liver failure: Transaminases nicely downtrending (AST/ALT)  With IV hydration although alkaline phosphatase/total bili are equivocal.  INR improved from 2 -->1.6-->1.3. Patient received IV vitamin K x3 doses during the initial hospital course.  Patient does not have any stigmata of ascites or edema.  Continue IV hydration for another day. off NAC now as d/w GI.  Avoid acetaminophen and other hepatotoxins. Checked ammonia level given complaints of weakness/poor appetite/somnolence: somewhat elevated at 50.Ordered low dose lactulose , seems to be tolerating well without exacerbating #2. GI added autoimmune w/u to labs . They will f/u in am  2.  Diarrhea/fever: Present on admission. Likely related to hepatitis A infection.  Now resolved.  Patient denies recent travel, eating outside or sick contacts.  Avoid acetaminophen, utilizing Advil as needed  3.  Alcohol abuse/dependence: No signs of withdrawal currently.  Continue multivitamin/thiamine.  4.  Drug abuse: No signs of withdrawal.  Last use of cocaine 5 days prior to admission.  Patient counseled of risks and advised to quit.  He appears motivated.  Will need outpatient substance abuse center referral.  5.  Leucopenia/thrombocytopenia: Likely related to #1 and #3. No splenomegaly on CT from 2018, [facility] hospital imagining reportedly WNL for liver/GB/spleen  6. FEN: Intake remains poor.  Resume IV fluids.  Dietitian consult.  Check ammonia level.   DVT prophylaxis: Lovenox Code Status: Full code Family / Patient Communication: Discussed with patient and all questions answered to satisfaction. Disposition Plan: Home when  medically cleared by GI, possibly in next 24 hours    LOS: 5 days    Time spent: 25 minutes    [place]ford Shi, MD [facility] Pager [phone]  If 7PM-7AM, please contact night-coverage [email or web address] Password [facility] 12/03/2018, 4:38 PM

## 2018-12-03 NOTE — Progress Notes (Signed)
IMPRESSION and PLAN:    Acute hep A with possible underlying ETOH liver disease. Neg HepC.  Autoimmune work-up pending. Acute liver failure (improving). LFTs trending down, INR normal. No HE.  Polysubstance abuse. Anxiety/depression.  Plan: -For now, continue supportive treatment. -No new GI recommendations.  No need for liver biopsy or any GI intervention. -Do recommend US abdomen. -He would need further WU for liver issues as an outpatient. FU with GI as out-pt in 2-4 weeks. -Stop all alcohol and drug use.  Have discussed with the patient extensively. -Add A1AT, ceruloplasmin, iron studies to today's labs.          Jeffrey Braun is a 33 y.o. male  Did not sleep well last night. No GI complaints Urine still dark. No itching. No fever or chills. Denies having any abdominal pain Does complain of back pain   Past Medical History:  Diagnosis Date  . Acute liver failure 11/2018  . Alcoholic hepatitis 11/2018  . Neck pain     Current Facility-Administered Medications  Medication Dose Route Frequency Provider Last Rate Last Dose  . 0.9 %  sodium chloride infusion  250 mL Intravenous PRN Alessandra BevelsKamineni, Neelima, MD      . enoxaparin (LOVENOX) injection 40 mg  40 mg Subcutaneous Q24H Lahoma CrockerSheehan, Theresa C, MD   40 mg at 12/02/18 1952  . folic acid (FOLVITE) tablet 1 mg  1 mg Oral Daily Lahoma CrockerSheehan, Theresa C, MD   1 mg at 12/02/18 1129  . ibuprofen (ADVIL) tablet 600 mg  600 mg Oral Q4H PRN Lahoma CrockerSheehan, Theresa C, MD   600 mg at 12/01/18 2107  . lactulose (CHRONULAC) 10 GM/15ML solution 10 g  10 g Oral Daily Alessandra BevelsKamineni, Neelima, MD   10 g at 12/02/18 1129  . LORazepam (ATIVAN) tablet 0.5 mg  0.5 mg Oral Q6H PRN Dorcas CarrowGhimire, Kuber, MD   0.5 mg at 12/01/18 2107  . multivitamin with minerals tablet 1 tablet  1 tablet Oral Daily Lahoma CrockerSheehan, Theresa C, MD   1 tablet at 12/02/18 1130  . ondansetron (ZOFRAN) tablet 4 mg  4 mg Oral Q6H PRN Lahoma CrockerSheehan, Theresa C, MD       Or  . ondansetron [facility](ZOFRAN)  injection 4 mg  4 mg Intravenous Q6H PRN Lahoma CrockerSheehan, Theresa C, MD      . sodium chloride flush (NS) 0.9 % injection 3 mL  3 mL Intravenous Q12H Lahoma CrockerSheehan, Theresa C, MD   3 mL at 12/02/18 2232  . sodium chloride flush (NS) 0.9 % injection 3 mL  3 mL Intravenous PRN Lahoma CrockerSheehan, Theresa C, MD      . thiamine (VITAMIN B-1) tablet 100 mg  100 mg Oral Daily Lahoma CrockerSheehan, Theresa C, MD   100 mg at 12/02/18 1129    Past Surgical History:  Procedure Laterality Date  . ABDOMINAL SURGERY    . CLEFT PALATE REPAIR    . EXTERNAL FIXATION LEG Left 11/30/2016   Procedure: APPLICATION EXTERNAL FIXATION LEFT  LEG;  Surgeon: Toni ArthursHewitt, John, MD;  Location: [facility] OR;  Service: Orthopedics;  Laterality: Left;  . EXTERNAL FIXATION REMOVAL Left 12/08/2016   Procedure: REMOVAL OF EXTERNAL FIXATION LEFT ANKLE;  Surgeon: Toni ArthursHewitt, John, MD;  Location: [facility];  Service: Orthopedics;  Laterality: Left;  . I&D EXTREMITY Left 11/30/2016   Procedure: IRRIGATION AND DEBRIDEMENT LEFT ANKLE;  Surgeon: Toni ArthursHewitt, John, MD;  Location: [facility] OR;  Service: Orthopedics;  Laterality: Left;  . I&D EXTREMITY Left 12/08/2016   Procedure: IRRIGATION  AND DEBRIDEMENT OPEN FRACTURE LEFT ANKLE;  Surgeon: Toni Arthurs, MD;  Location: [facility];  Service: Orthopedics;  Laterality: Left;  . NECK SURGERY    . ORIF FIBULA FRACTURE Left 12/08/2016   Procedure: OPEN REDUCTION INTERNAL FIXATION (ORIF) LEFT PILON AND FIBULA FRACTURE;  Surgeon: Toni Arthurs, MD;  Location: [facility];  Service: Orthopedics;  Laterality: Left;    Family History  Problem Relation Age of Onset  . Heart disease Mother   . Heart disease Father     Social History   Tobacco Use  . Smoking status: Current Every Day Smoker    Packs/day: 1.00    Types: Cigarettes  . Smokeless tobacco: Former Neurosurgeon    Types: Snuff  Substance Use Topics  . Alcohol use: Yes    Alcohol/week: 126.0 standard drinks    Types: 84 Cans of beer, 42 Shots of liquor per  week    Comment: drinks as much as possible all day until past Midnight  . Drug use: Yes    Types: Marijuana, Benzodiazepines    Comment: last smoked 1 year ago    No Known Allergies   Review of Systems: All systems reviewed and negative except where noted in HPI.    Physical Exam:     BP 122/89 (BP Location: Right Arm)   Pulse 77   Temp 98.5 F (36.9 C) (Oral)   Resp 16   Ht 5\' 10"  (1.778 m)   Wt 88.1 kg   SpO2 97%   BMI 27.87 kg/m  Wt Readings from Last 3 Encounters:  12/03/18 88.1 kg  12/08/16 100.9 kg  11/29/16 101.2 kg   GENERAL:  Alert, oriented, cooperative, not in acute distress. PSYCH: :Pleasant, normal mood and affect. HEENT:  conjunctiva pink, mucous membranes moist, neck supple without masses. No jaundice. CARDIAC:  S1 S2 normal. No murmers. PULM: Normal respiratory effort, lungs CTA bilaterally, no wheezing. ABDOMEN: Inspection: No visible peristalsis, no abnormal pulsations, skin normal.  Palpation/percussion: Soft, nontender, nondistended, no rigidity, no abnormal dullness to percussion, no hepatosplenomegaly and no palpable abdominal masses.  Auscultation: Normal bowel sounds, no abdominal bruits.  Well-healed surgical scar. Rectal exam: Deferred SKIN:  turgor, no lesions seen. Musculoskeletal:  Normal muscle tone, normal strength. NEURO: Alert and oriented x 3, no focal neurologic deficits.   Data Reviewed: I have personally reviewed following labs and imaging studies  CBC: Recent Labs  Lab 11/28/18 1921 11/29/18 0114 11/30/18 0159  WBC 3.3* 3.2* 3.3*  NEUTROABS 2.2  --   --   HGB 14.7 14.5 13.6  HCT 40.5 39.6 37.4*  MCV 91.4 91.2 91.9  PLT 94* 90* 92*   Basic Metabolic Panel: Recent Labs  Lab 12/01/18 0724 12/02/18 0343 12/03/18 0315  NA 136 136 136  K 3.9 3.8 4.1  CL 102 102 100  CO2 23 25 26   GLUCOSE 78 63* 85  BUN 9 9 8   CREATININE 1.12 1.02 1.15  CALCIUM 8.5* 8.7* 8.7*   GFR: Estimated Creatinine Clearance: 102.1 mL/min  (by C-G formula based on SCr of 1.15 mg/dL). Liver Function Tests: Recent Labs  Lab 11/30/18 0159 11/30/18 2105 12/01/18 0724 12/02/18 0343 12/03/18 0315  AST >10,000* 9,191* 8,455* 6,158* 4,433*  ALT 6,787* 5,583* 5,024* 4,236* 3,463*  ALKPHOS 126 133* 156* 153* 145*  BILITOT 4.1* 4.1* 5.3* 5.2* 6.1*  PROT 5.6* 5.3* 5.6* 5.8* 5.8*  ALBUMIN 2.9* 2.7* 2.9* 2.9* 2.9*   No results for input(s): LIPASE, AMYLASE in the last 168  hours. Recent Labs  Lab 12/02/18 0343  AMMONIA 50*   Coagulation Profile: Recent Labs  Lab 12/02/18 0343 12/03/18 0315  INR 1.3* 1.1   HbA1C: No results for input(s): HGBA1C in the last 72 hours. Lipid Profile: No results for input(s): CHOL, HDL, LDLCALC, TRIG, CHOLHDL, LDLDIRECT in the last 72 hours. Thyroid Function Tests: No results for input(s): TSH, T4TOTAL, FREET4, T3FREE, THYROIDAB in the last 72 hours. Anemia Panel: No results for input(s): VITAMINB12, FOLATE, FERRITIN, TIBC, IRON, RETICCTPCT in the last 72 hours.  Recent Results (from the past 240 hour(s))  Culture, blood (x 2)     Status: None   Collection Time: 11/28/18  7:22 PM   Specimen: BLOOD  Result Value Ref Range Status   Specimen Description BLOOD RIGHT ANTECUBITAL  Final   Special Requests   Final    BOTTLES DRAWN AEROBIC AND ANAEROBIC Blood Culture results may not be optimal due to an inadequate volume of blood received in culture bottles   Culture   Final    NO GROWTH 5 DAYS Performed at [facility], [place] [address] [place], [place] [address]    Report Status 12/03/2018 FINAL  Final  Culture, blood (x 2)     Status: None   Collection Time: 11/28/18  7:28 PM   Specimen: BLOOD LEFT HAND  Result Value Ref Range Status   Specimen Description BLOOD LEFT HAND  Final   Special Requests   Final    BOTTLES DRAWN AEROBIC AND ANAEROBIC Blood Culture adequate volume   Culture   Final    NO GROWTH 5 DAYS Performed at [facility], [place] [address] [place], [place]  [address]    Report Status 12/03/2018 FINAL  Final      Radiology Studies: No results found.    Bianney Rockwood,MD 12/03/2018, 11:06 AM   CC No ref. provider found

## 2018-12-03 NOTE — Progress Notes (Signed)
PT Cancellation Note  Patient Details Name: Jeffrey Braun MRN: [id number] DOB: 08-29-1985   Cancelled Treatment:    Reason Eval/Treat Not Completed: Other (comment).  Pt is feeling he is able to walk without any issues, but will check on him tomorrow given recent AMS and feeling unbalanced.  Pt in agreement with PT stopping by to ask again, and then PT will likely DC if not needed.   Ramond Dial 12/03/2018, 3:05 PM   Mee Hives, PT MS Acute Rehab Dept. Number: [place]

## 2018-12-03 NOTE — Progress Notes (Signed)
Initial Nutrition Assessment  DOCUMENTATION CODES:   Not applicable  INTERVENTION:   -Boost Breeze po TID, each supplement provides 250 kcal and 9 grams of protein -Continue MVI with minerals daily -Magic cup TID with meals, each supplement provides 290 kcal and 9 grams of protein  NUTRITION DIAGNOSIS:   Increased nutrient needs related to acute illness(acute liver failure) as evidenced by estimated needs.  GOAL:   Patient will meet greater than or equal to 90% of their needs  MONITOR:   PO intake, Supplement acceptance, Labs, Weight trends, Skin, I & O's  REASON FOR ASSESSMENT:   Consult Assessment of nutrition requirement/status  ASSESSMENT:   Jeffrey Braun is a 33 y.o. male with medical history significant of psychiatric illness, alcohol abuse, comes to the [facility] for evaluation of 2 week history of increased anxiety, hearing ringing sensation in his ears, having headaches, fever, chills and shaking.  Patient was initially seen the morning of 8/25 with similar illness. He c/o diarrhea for 1 week, having fever, feeling chills , shaky, pain all over especially headache. report having fever since 3 days. no known sick contact or covid positive contacts.He has been drinking at-least 6 pack beer daily and last 3 days "heavy alcohol". He also admits taking OTC-pain meds- ibuprofen, tylenol on regular basis " I JUST POP IN FEW PILLS" for knee pain whenever needed, almost on daily basis and reports not taking too much tylenol, limits to less than 2100 mg/day. He reported that he was seen in [facility] earlier in the week  for psych eval and was released.In ER labs were abnormal with increased LFT, GI was consulted and appropriate for admission here.  In the ER he was alert but restless, with fast speech, nonfocal.  Patient was seen by tele psych.  Pt admitted with cute liver failure.   Reviewed I/O's: +960 ml x 24 hours and +4.4 L since admission  Pt sleeping soundly  x 2 visits and unable to waken. Per MD notes, pt did not sleep well last night and refused therapy treatment earlier this morning.   Meal completion 50-100%. Per chart review, pt with poor appetite and is consuming mainly liquids off of meal trays. Given ETOH abuse and drug abuse, suspect oral intake was inadequate PTA however, unable to confirm at this time.   Medications reviewed and include folic acid, lactulose, thiamine, and MVI.   Labs reviewed.   Diet Order:   Diet Order            Diet regular Room service appropriate? Yes; Fluid consistency: Thin  Diet effective now              EDUCATION NEEDS:   No education needs have been identified at this time  Skin:  Skin Assessment: Reviewed RN Assessment  Last BM:  12/02/18  Height:   Ht Readings from Last 1 Encounters:  11/28/18 5\' 10"  (1.778 m)    Weight:   Wt Readings from Last 1 Encounters:  12/03/18 88.1 kg    Ideal Body Weight:  52.3 kg  BMI:  Body mass index is 27.87 kg/m.  Estimated Nutritional Needs:   Kcal:  2000-2200  Protein:  105-120 grams  Fluid:  > 2 L    Caryl Manas A. Jimmye Norman, RD, LDN, [place] Registered Dietitian II Certified Diabetes Care and Education Specialist Pager: [phone] After hours Pager: [phone]

## 2018-12-04 LAB — COMPREHENSIVE METABOLIC PANEL
ALT: 2474 U/L — ABNORMAL HIGH (ref 0–44)
AST: 2748 U/L — ABNORMAL HIGH (ref 15–41)
Albumin: 2.8 g/dL — ABNORMAL LOW (ref 3.5–5.0)
Alkaline Phosphatase: 156 U/L — ABNORMAL HIGH (ref 38–126)
Anion gap: 10 (ref 5–15)
BUN: 8 mg/dL (ref 6–20)
CO2: 25 mmol/L (ref 22–32)
Calcium: 8.4 mg/dL — ABNORMAL LOW (ref 8.9–10.3)
Chloride: 99 mmol/L (ref 98–111)
Creatinine, Ser: 1.24 mg/dL (ref 0.61–1.24)
GFR calc Af Amer: >60 mL/min (ref >60)
GFR calc non Af Amer: >60 mL/min (ref >60)
Glucose, Bld: 133 mg/dL — ABNORMAL HIGH (ref 70–99)
Potassium: 3.4 mmol/L — ABNORMAL LOW (ref 3.5–5.1)
Sodium: 134 mmol/L — ABNORMAL LOW (ref 135–145)
Total Bilirubin: 6.5 mg/dL — ABNORMAL HIGH (ref 0.3–1.2)
Total Protein: 5.8 g/dL — ABNORMAL LOW (ref 6.5–8.1)

## 2018-12-04 LAB — PROTIME-INR
INR: 1.2 (ref 0.8–1.2)
Prothrombin Time: 14.6 seconds (ref 11.4–15.2)

## 2018-12-04 MED ORDER — POTASSIUM CHLORIDE CRYS ER 20 MEQ PO TBCR
40.0000 meq | EXTENDED_RELEASE_TABLET | Freq: Once | ORAL | Status: AC
  Administered 2018-12-04: 40 meq via ORAL
  Filled 2018-12-04: qty 2

## 2018-12-04 NOTE — TOC Progression Note (Signed)
Transition of Care [facility]) - Progression Note    Patient Details  Name: Jeffrey Braun MRN: [id number] Date of Birth: [birth year]  Transition of Care [facility]) CM/SW [place], LCSW Phone Number: 12/04/2018, 1:53 PM  Clinical Narrative:    CSW spoke with patient regarding alcohol and substance use. He reported that he does drink a lot but because he has not had a place to live and has been stressed due to recent split from his wife. He states his daughters are what keeps him going. He states he is going to stay with his godfather at discharge. CSW provided homeless and substance use resources to patient. He confirmed that his family is picking him up.   Expected Discharge Plan: Home/Self Care Barriers to Discharge: Continued Medical Work up  Expected Discharge Plan and Services Expected Discharge Plan: Home/Self Care In-house Referral: Clinical Social Work, PCP / Psychologist, educational, Conservation officer, nature Services: CM Consult, [facility], [facility] Post Acute Care Choice: NA Living arrangements for the past 2 months: Hotel/Motel, Homeless Expected Discharge Date: 12/04/18               DME Arranged: N/A DME Agency: NA       HH Arranged: NA HH Agency: NA         Social Determinants of Health (SDOH) Interventions    Readmission Risk Interventions No flowsheet data found.

## 2018-12-04 NOTE — Plan of Care (Signed)
Care plan interventions met

## 2018-12-04 NOTE — Evaluation (Signed)
Physical Therapy Evaluation and Discharge Patient Details Name: Jeffrey Braun MRN: [id number] DOB: 1985-08-10 Today's Date: 12/04/2018   History of Present Illness  33 yo male with onset of liver failure without hepatic coma was admitted for medical management of his condition.  Underlying EtOH and cocaine abuse, with opiate use and now has diagnosed Hep A, leucopenia, thrombocytopenia. Working on ammonia levels and fever is resolved.  PMHx:  EtOH abuse,  Clinical Impression  Pt was seen for movement assessment and to discuss follow up.  He is able to walk but is tired from poor sleep quality and low intake as well as his medical issues.  Have recommended he work on longer gait trips to build his energy, and to be aware of hydration needs in the weather this time of year.  He is in agreement with the plan, and is aware that PT does not plan to order follow up therapy.  Discharge PT for now as pt is able to safely walk and is not demonstrating a fall risk.    Follow Up Recommendations No PT follow up    Equipment Recommendations  None recommended by PT    Recommendations for Other Services       Precautions / Restrictions Precautions Precautions: None Restrictions Weight Bearing Restrictions: No      Mobility  Bed Mobility Overal bed mobility: Modified Independent             General bed mobility comments: takes a moment to get up with railing but has chronic L knee pain  Transfers Overall transfer level: Independent Equipment used: None                Ambulation/Gait Ambulation/Gait assistance: Supervision Gait Distance (Feet): 400 Feet Assistive device: IV Pole(not supporting the effort) Gait Pattern/deviations: Step-through pattern;Wide base of support;Decreased stride length Gait velocity: reduced Gait velocity interpretation: <1.31 ft/sec, indicative of household ambulator General Gait Details: pt has slower pace due to his pain in knee and having been  off his feet  Stairs            Wheelchair Mobility    Modified Rankin (Stroke Patients Only)       Balance Overall balance assessment: Modified Independent                                           Pertinent Vitals/Pain Pain Assessment: 0-10 Pain Score: 5  Pain Location: L knee from old ORIF Pain Descriptors / Indicators: Dull Pain Intervention(s): Monitored during session;Repositioned    Home Living Family/patient expects to be discharged to:: Private residence Living Arrangements: Alone Available Help at Discharge: Family;Available PRN/intermittently Type of Home: House Home Access: Level entry     Home Layout: One level Home Equipment: None      Prior Function Level of Independence: Independent         Comments: no recent falls, has chronic L knee pain     Hand Dominance   Dominant Hand: Right    Extremity/Trunk Assessment   Upper Extremity Assessment Upper Extremity Assessment: Overall WFL for tasks assessed    Lower Extremity Assessment Lower Extremity Assessment: Overall WFL for tasks assessed    Cervical / Trunk Assessment Cervical / Trunk Assessment: Normal  Communication   Communication: No difficulties  Cognition Arousal/Alertness: Awake/alert Behavior During Therapy: WFL for tasks assessed/performed Overall Cognitive Status: Within Functional Limits for tasks  assessed                                 General Comments: 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      General Comments General comments (skin integrity, edema, etc.): pt is able to walk independently with IV pole to manage, occas loses track of it and bumps lightly into obstacles    Exercises     Assessment/Plan    PT Assessment Patent does not need any further PT services  PT Problem List         PT Treatment Interventions      PT Goals (Current goals can be found in the Care Plan  section)  Acute Rehab PT Goals Patient Stated Goal: to go home today PT Goal Formulation: With patient Time For Goal Achievement: 12/07/18 Potential to Achieve Goals: Good    Frequency     Barriers to discharge        Co-evaluation               AM-PAC PT "6 Clicks" Mobility  Outcome Measure Help needed turning from your back to your side while in a flat bed without using bedrails?: None Help needed moving from lying on your back to sitting on the side of a flat bed without using bedrails?: None Help needed moving to and from a bed to a chair (including a wheelchair)?: None Help needed standing up from a chair using your arms (e.g., wheelchair or bedside chair)?: None Help needed to walk in hospital room?: None Help needed climbing 3-5 steps with a railing? : A Little 6 Click Score: 23    End of Session   Activity Tolerance: Patient limited by fatigue(poor sleep quality) Patient left: in bed;with call bell/phone within reach(family on the phone) Nurse Communication: Mobility status PT Visit Diagnosis: Difficulty in walking, not elsewhere classified (R26.2)    Time: [id number] PT Time Calculation (min) (ACUTE ONLY): 34 min   Charges:   PT Evaluation $PT Eval Moderate Complexity: 1 Mod PT Treatments $Gait Training: 8-22 mins       Ivar DrapeRuth E Dariah Mcsorley 12/04/2018, 9:50 AM   Samul Dadauth Kimble Delaurentis, PT MS Acute Rehab Dept. Number: [facility] [phone] and [facility] [phone]

## 2018-12-04 NOTE — TOC Initial Note (Signed)
Transition of Care [facility]) - Initial/Assessment Note    Patient Details  Name: Jeffrey Braun MRN: [id number] Date of Birth: 1985-06-15  Transition of [facility]) CM/SW Contact:    Bartholomew Crews, RN Phone Number: [phone] 12/04/2018, 10:38 AM  Clinical Narrative:                 Spoke with patient at the bedside. PTA homeless. Had been staying [facility] for $100/week but when work dried up so did the money. Address listed on chart is an address he uses for mail, but not where he resides. Permission granted verbally to speak with sister and father. Patient called sister, Jeffrey Braun, and put her on speaker phone. Patient will have somewhere to stay which will be in [facility]. Discussed hospital f/u and primary care through [place] - agreed to follow up appointment which has been scheduled for 9/21 at 10:30. No PT recommendations. Addiction information provided on AVS. If medications needed at discharge please send to [facility] to ensure that medications are picked up by patient. Patient's dad will pick up today when ready for anticipated discharge today. CM to follow for transition of care needs.        Patient Goals and CMS Choice        Expected Discharge Plan and Services                                                Prior Living Arrangements/Services                       Activities of Daily Living Home Assistive Devices/Equipment: None ADL Screening (condition at time of admission) Patient's cognitive ability adequate to safely complete daily activities?: Yes Is the patient deaf or have difficulty hearing?: No Does the patient have difficulty seeing, even when wearing glasses/contacts?: No Does the patient have difficulty concentrating, remembering, or making decisions?: No Patient able to express need for assistance with ADLs?: Yes Does the patient have difficulty dressing or bathing?: No Independently performs ADLs?: Yes  (appropriate for developmental age) Does the patient have difficulty walking or climbing stairs?: No Weakness of Legs: None Weakness of Arms/Hands: None  Permission Sought/Granted                  Emotional Assessment              Admission diagnosis:  ABNORMAL RESULTS OF LIVER FUNCTION STUDIES Patient Active Problem List   Diagnosis Date Noted  . Acute hepatitis A virus infection 11/28/2018  . Acute liver failure without hepatic coma 11/28/2018  . Alcoholism ([place]) 11/28/2018  . Drug use 11/28/2018  . Fever 11/28/2018  . Thrombocytopenia ([place]) 11/28/2018  . Mild major depression ([place]) 11/28/2018  . Alcoholic hepatitis without ascites 11/28/2018  . 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 Complication of external fixation device with internal components ([place]) 11/30/2016  . Bimalleolar ankle fracture, left, open type III, initial encounter 11/30/2016   PCP:  Patient, No Pcp Per Pharmacy:   [facility] [address] [place] [address] [place] [address] Phone: [phone] Fax: [phone]     Social Determinants of Health (SDOH) Interventions    Readmission Risk Interventions No flowsheet data found.

## 2018-12-04 NOTE — Discharge Summary (Signed)
Physician Discharge Summary  Jeffrey Braun ZOX:[id number] DOB: 1985/04/24 DOA: 11/28/2018  PCP: Patient, No Pcp Per  Admit date: 11/28/2018 Discharge date: 12/04/2018  Ad[facility] From: [facility] via [facility]. Disposition: Home.  Recommendations for Outpatient Follow-up:  1. Follow up with PCP as a scheduled. 2. Follow-up with gastroenterology. 3. Repeat liver test in 1 to 2 weeks.  Home Health: Not applicable Equipment/Devices: Not applicable  Discharge Condition: Stable CODE STATUS: Full code Diet recommendation: Regular diet  Brief/Interim Summary: 33 year old male with history of heavy alcohol use (6 pack beer a day or more per spouse), cocaine abuse (last use 5 days prior to admission), opiate abuse presented to [facility] with fever chills, headache and tinnitus.  Work-up revealed significantly elevated LFTs.  CT head/CT abdomen pelvis/ultrasonogram abdomen were essentially negative.  Viral hepatitis panel resulted positive for hepatitis A IgM.  On admission, he also reported taking over-the-counter pain medications including NSAIDs/acetaminophen but acetaminophen level was not elevated.  Patient [facility].  Given worsening of LFTs (AST is now greater than 10,000 and his ALT is greater than 5200) and elevated INR, case was discussed with GI/hepatology who recommended starting on NAC x21 hours (since bilirubin was not high enough as expected in acute hepatitis A, Tylenol injury was suspected and treated empirically) and transferred to [facility] after [facility].  Discharge Diagnoses:  Principal Problem:   Acute liver failure without hepatic coma Active Problems:   Acute hepatitis A virus infection   Alcoholism (HCC)   Drug use   Fever   Thrombocytopenia (HCC)   Mild major depression (HCC)   Alcoholic hepatitis without ascites   Fulminant hepatitis  Acute liver failure: Transaminases  were elevated very high, more than 10,000.  Treated symptomatically with improvement.  Further downtrending.  INR improved from 2-1.6-1.3. Patient was treated with IV fluids, vitamin K, N-acetylcysteine.  Ammonia level was normal. His bilirubin is elevated, however transaminases are appropriately improving. Patient is currently stable, normal appetite and asymptomatic. Multiple autoimmune labs are pending. As per GI recommendation, he is going home, he will follow-up with primary care physician and gastroenterology.  Patient was extensively educated regarding not using alcohol, Tylenol products, any over-the-counter medications and drugs.  Patient is motivated. He does not require any medications to take home.   Discharge Instructions  Discharge Instructions    Call MD for:  extreme fatigue   Complete by: As directed    Call MD for:  persistant nausea and vomiting   Complete by: As directed    Call MD for:  severe uncontrolled pain   Complete by: As directed    Diet - low sodium heart healthy   Complete by: As directed    Discharge instructions   Complete by: As directed    No alcohol in any form No Tylenol or Tylenol containing medications Do not use any medications without consulting your doctor.   Increase activity slowly   Complete by: As directed      Allergies as of 12/04/2018   No Known Allergies     Medication List    STOP taking these medications   enoxaparin 40 MG/0.4ML injection Commonly known as: LOVENOX   senna 8.6 MG Tabs tablet Commonly known as: SENOKOT       No Known Allergies  Consultations:  Gastroenterology   Procedures/Studies:  No results found.   Subjective: Patient seen and examined.  Denies any nausea vomiting.  Normal appetite.  Denies any abdominal pain.  He had 3 bowel movements today after receiving lactulose last night.  Potassium will be replaced before discharge. Patient is motivated and wants to keep up with his promises and  follow-ups. He is very well educated as well as his family about medications and drugs to avoid.   Discharge Exam: Vitals:   12/04/18 0028 12/04/18 0543  BP: 137/89 134/88  Pulse: 77 76  Resp:    Temp: 99.7 F (37.6 C) 99.1 F (37.3 C)  SpO2: 97% 96%   Vitals:   12/03/18 1131 12/04/18 0028 12/04/18 0500 12/04/18 0543  BP: 123/81 137/89  134/88  Pulse: 88 77  76  Resp: 18     Temp: 97.6 F (36.4 C) 99.7 F (37.6 C)  99.1 F (37.3 C)  TempSrc: Oral Oral    SpO2: 97% 97%  96%  Weight:   89.2 kg   Height:        General: Pt is alert, awake, not in acute distress Cardiovascular: RRR, S1/S2 +, no rubs, no gallops Respiratory: CTA bilaterally, no wheezing, no rhonchi Abdominal: Soft, NT, ND, bowel sounds + Extremities: no edema, no cyanosis    The results of significant diagnostics from this hospitalization (including imaging, microbiology, ancillary and laboratory) are listed below for reference.     Microbiology: Recent Results (from the past 240 hour(s))  Culture, blood (x 2)     Status: None   Collection Time: 11/28/18  7:22 PM   Specimen: BLOOD  Result Value Ref Range Status   Specimen Description BLOOD RIGHT ANTECUBITAL  Final   Special Requests   Final    BOTTLES DRAWN AEROBIC AND ANAEROBIC Blood Culture results may not be optimal due to an inadequate volume of blood received in culture bottles   Culture   Final    NO GROWTH 5 DAYS Performed at [facility], [place] [address], [place], [place] [address]    Report Status 12/03/2018 FINAL  Final  Culture, blood (x 2)     Status: None   Collection Time: 11/28/18  7:28 PM   Specimen: BLOOD LEFT HAND  Result Value Ref Range Status   Specimen Description BLOOD LEFT HAND  Final   Special Requests   Final    BOTTLES DRAWN AEROBIC AND ANAEROBIC Blood Culture adequate volume   Culture   Final    NO GROWTH 5 DAYS Performed at [facility], [place] [address], [place],  [address]    Report Status  12/03/2018 FINAL  Final     Labs: BNP (last 3 results) No results for input(s): BNP in the last 8760 hours. Basic Metabolic Panel: Recent Labs  Lab 11/30/18 2105 12/01/18 0724 12/02/18 0343 12/03/18 0315 12/04/18 0232  NA 136 136 136 136 134*  K 3.8 3.9 3.8 4.1 3.4*  CL 103 102 102 100 99  CO2 20* 23 25 26 25   GLUCOSE 100* 78 63* 85 133*  BUN 10 9 9 8 8   CREATININE 1.11 1.12 1.02 1.15 1.24  CALCIUM 8.3* 8.5* 8.7* 8.7* 8.4*   Liver Function Tests: Recent Labs  Lab 11/30/18 2105 12/01/18 0724 12/02/18 0343 12/03/18 0315 12/04/18 0232  AST 9,191* 8,455* 6,158* 4,433* 2,748*  ALT 5,583* 5,024* 4,236* 3,463* 2,474*  ALKPHOS 133* 156* 153* 145* 156*  BILITOT 4.1* 5.3* 5.2* 6.1* 6.5*  PROT 5.3* 5.6* 5.8* 5.8* 5.8*  ALBUMIN 2.7* 2.9* 2.9* 2.9* 2.8*   No results for input(s): LIPASE, AMYLASE in the last 168  hours. Recent Labs  Lab 12/02/18 0343  AMMONIA 50*   CBC: Recent Labs  Lab 11/28/18 1921 11/29/18 0114 11/30/18 0159  WBC 3.3* 3.2* 3.3*  NEUTROABS 2.2  --   --   HGB 14.7 14.5 13.6  HCT 40.5 39.6 37.4*  MCV 91.4 91.2 91.9  PLT 94* 90* 92*   Cardiac Enzymes: No results for input(s): CKTOTAL, CKMB, CKMBINDEX, TROPONINI in the last 168 hours. BNP: Invalid input(s): POCBNP CBG: No results for input(s): GLUCAP in the last 168 hours. D-Dimer No results for input(s): DDIMER in the last 72 hours. Hgb A1c No results for input(s): HGBA1C in the last 72 hours. Lipid Profile No results for input(s): CHOL, HDL, LDLCALC, TRIG, CHOLHDL, LDLDIRECT in the last 72 hours. Thyroid function studies No results for input(s): TSH, T4TOTAL, T3FREE, THYROIDAB in the last 72 hours.  Invalid input(s): FREET3 Anemia work up No results for input(s): VITAMINB12, FOLATE, FERRITIN, TIBC, IRON, RETICCTPCT in the last 72 hours. Urinalysis    Component Value Date/Time   COLORURINE STRAW (A) 11/30/2016 0233   APPEARANCEUR CLEAR 11/30/2016 0233   LABSPEC 1.031 (H) 11/30/2016 0233    PHURINE 5.0 11/30/2016 0233   GLUCOSEU NEGATIVE 11/30/2016 0233   HGBUR NEGATIVE 11/30/2016 0233   BILIRUBINUR NEGATIVE 11/30/2016 0233   KETONESUR NEGATIVE 11/30/2016 0233   PROTEINUR NEGATIVE 11/30/2016 0233   NITRITE NEGATIVE 11/30/2016 0233   LEUKOCYTESUR NEGATIVE 11/30/2016 0233   Sepsis Labs Invalid input(s): PROCALCITONIN,  WBC,  LACTICIDVEN Microbiology Recent Results (from the past 240 hour(s))  Culture, blood (x 2)     Status: None   Collection Time: 11/28/18  7:22 PM   Specimen: BLOOD  Result Value Ref Range Status   Specimen Description BLOOD RIGHT ANTECUBITAL  Final   Special Requests   Final    BOTTLES DRAWN AEROBIC AND ANAEROBIC Blood Culture results may not be optimal due to an inadequate volume of blood received in culture bottles   Culture   Final    NO GROWTH 5 DAYS Performed at [facility], [address], [place], [place] [address]    Report Status 12/03/2018 FINAL  Final  Culture, blood (x 2)     Status: None   Collection Time: 11/28/18  7:28 PM   Specimen: BLOOD LEFT HAND  Result Value Ref Range Status   Specimen Description BLOOD LEFT HAND  Final   Special Requests   Final    BOTTLES DRAWN AEROBIC AND ANAEROBIC Blood Culture adequate volume   Culture   Final    NO GROWTH 5 DAYS Performed at [facility], [address], [place], [place] [address]    Report Status 12/03/2018 FINAL  Final     Time coordinating discharge:  35 minutes  SIGNED:   Dorcas Carrow, MD  [facility] 12/04/2018, 12:54 PM

## 2018-12-04 NOTE — Progress Notes (Signed)
Patient was stable at discharge. I removed their IV. We reviewed the discharge education. Patient/Family verbalized understanding and had no further questions. Patient left with prescription/s in hand.  

## 2018-12-24 ENCOUNTER — Inpatient Hospital Stay (INDEPENDENT_AMBULATORY_CARE_PROVIDER_SITE_OTHER): Payer: Self-pay | Admitting: Primary Care

## 2019-01-16 ENCOUNTER — Emergency Department (HOSPITAL_COMMUNITY)
Admission: EM | Admit: 2019-01-16 | Discharge: 2019-01-16 | Disposition: A | Discharge status: Home / Self Care | Payer: Self-pay | Attending: Emergency Medicine | Admitting: Emergency Medicine

## 2019-01-16 DIAGNOSIS — K7200 Acute and subacute hepatic failure without coma: Secondary | ICD-10-CM | POA: Insufficient documentation

## 2019-01-16 DIAGNOSIS — F17210 Nicotine dependence, cigarettes, uncomplicated: Secondary | ICD-10-CM | POA: Insufficient documentation

## 2019-01-16 DIAGNOSIS — Z20828 Contact with and (suspected) exposure to other viral communicable diseases: Secondary | ICD-10-CM | POA: Insufficient documentation

## 2019-01-16 DIAGNOSIS — K7011 Alcoholic hepatitis with ascites: Secondary | ICD-10-CM | POA: Insufficient documentation

## 2019-01-16 DIAGNOSIS — R45851 Suicidal ideations: Secondary | ICD-10-CM | POA: Insufficient documentation

## 2019-01-16 DIAGNOSIS — F329 Major depressive disorder, single episode, unspecified: Secondary | ICD-10-CM | POA: Insufficient documentation

## 2019-01-16 LAB — PROTIME-INR
INR: >10 (ref 0.8–1.2)
INR: 0.9 (ref 0.8–1.2)
Prothrombin Time: >90 seconds — ABNORMAL HIGH (ref 11.4–15.2)
Prothrombin Time: 12 seconds (ref 11.4–15.2)

## 2019-01-16 LAB — CBC WITH DIFFERENTIAL/PLATELET
Abs Immature Granulocytes: 0.03 10*3/uL (ref 0.00–0.07)
Basophils Absolute: 0 10*3/uL (ref 0.0–0.1)
Basophils Relative: 1 %
Eosinophils Absolute: 0.1 10*3/uL (ref 0.0–0.5)
Eosinophils Relative: 2 %
HCT: 45.4 % (ref 39.0–52.0)
Hemoglobin: 15.8 g/dL (ref 13.0–17.0)
Immature Granulocytes: 1 %
Lymphocytes Relative: 42 %
Lymphs Abs: 2.5 10*3/uL (ref 0.7–4.0)
MCH: 32.7 pg (ref 26.0–34.0)
MCHC: 34.8 g/dL (ref 30.0–36.0)
MCV: 94 fL (ref 80.0–100.0)
Monocytes Absolute: 0.5 10*3/uL (ref 0.1–1.0)
Monocytes Relative: 9 %
Neutro Abs: 2.9 10*3/uL (ref 1.7–7.7)
Neutrophils Relative %: 45 %
Platelets: 306 10*3/uL (ref 150–400)
RBC: 4.83 MIL/uL (ref 4.22–5.81)
RDW: 13.1 % (ref 11.5–15.5)
WBC: 6.1 10*3/uL (ref 4.0–10.5)
nRBC: 0 % (ref 0.0–0.2)

## 2019-01-16 LAB — COMPREHENSIVE METABOLIC PANEL
ALT: 27 U/L (ref 0–44)
AST: 42 U/L — ABNORMAL HIGH (ref 15–41)
Albumin: 4.7 g/dL (ref 3.5–5.0)
Alkaline Phosphatase: 81 U/L (ref 38–126)
Anion gap: 11 (ref 5–15)
BUN: 11 mg/dL (ref 6–20)
CO2: 25 mmol/L (ref 22–32)
Calcium: 9.6 mg/dL (ref 8.9–10.3)
Chloride: 101 mmol/L (ref 98–111)
Creatinine, Ser: 0.86 mg/dL (ref 0.61–1.24)
GFR calc Af Amer: >60 mL/min (ref >60)
GFR calc non Af Amer: >60 mL/min (ref >60)
Glucose, Bld: 94 mg/dL (ref 70–99)
Potassium: 3.9 mmol/L (ref 3.5–5.1)
Sodium: 137 mmol/L (ref 135–145)
Total Bilirubin: 1.2 mg/dL (ref 0.3–1.2)
Total Protein: 8.5 g/dL — ABNORMAL HIGH (ref 6.5–8.1)

## 2019-01-16 LAB — ACETAMINOPHEN LEVEL: Acetaminophen (Tylenol), Serum: <10 ug/mL — ABNORMAL LOW (ref 10–30)

## 2019-01-16 LAB — SARS CORONAVIRUS 2 BY RT PCR (HOSPITAL ORDER, PERFORMED IN ~~LOC~~ HOSPITAL LAB): SARS Coronavirus 2: NEGATIVE

## 2019-01-16 LAB — LIPASE, BLOOD: Lipase: 43 U/L (ref 11–51)

## 2019-01-16 LAB — RAPID URINE DRUG SCREEN, HOSP PERFORMED
Amphetamines: POSITIVE — AB
Barbiturates: NOT DETECTED
Benzodiazepines: NOT DETECTED
Cocaine: NOT DETECTED
Opiates: NOT DETECTED
Tetrahydrocannabinol: POSITIVE — AB

## 2019-01-16 LAB — ETHANOL: Alcohol, Ethyl (B): 14 mg/dL — ABNORMAL HIGH (ref <10)

## 2019-01-16 NOTE — ED Provider Notes (Signed)
[facility]-EMERGENCY DEPT Provider Note   CSN: [id number] Arrival date & time: 01/16/19  1030     History   Chief Complaint Chief Complaint  Patient presents with  . IVC'd  . Mental Health Problem    HPI Jeffrey Braun is a 33 y.o. male.     The history is provided by the patient.  Mental Health Problem Presenting symptoms: suicidal thoughts   Presenting symptoms: no agitation   Patient accompanied by:  Caregiver Degree of incapacity (severity):  Mild Onset quality:  Sudden Timing:  Constant Progression:  Unchanged Chronicity:  New Context: drug abuse   Treatment compliance:  Untreated Relieved by:  Nothing Worsened by:  Nothing Associated symptoms: anxiety and poor judgment   Associated symptoms: no abdominal pain and no chest pain   Risk factors: no hx of mental illness     Past Medical History:  Diagnosis Date  . Acute liver failure 11/2018  . Alcoholic hepatitis 11/2018  . Neck pain     Patient Active Problem List   Diagnosis Date Noted  . Acute hepatitis A virus infection 11/28/2018  . Acute liver failure without hepatic coma 11/28/2018  . Alcoholism (HCC) 11/28/2018  . Drug use 11/28/2018  . Fever 11/28/2018  . Thrombocytopenia (HCC) 11/28/2018  . Mild major depression (HCC) 11/28/2018  . Alcoholic hepatitis without ascites 11/28/2018  . 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 Complication of external fixation device with internal components (HCC) 11/30/2016  . Bimalleolar ankle fracture, left, open type III, initial encounter 11/30/2016    Past Surgical History:  Procedure Laterality Date  . ABDOMINAL SURGERY    . CLEFT PALATE REPAIR    . EXTERNAL FIXATION LEG Left 11/30/2016   Procedure: APPLICATION EXTERNAL FIXATION LEFT  LEG;  Surgeon: Toni Arthurs, MD;  Location: [facility];  Service: Orthopedics;  Laterality: Left;  . EXTERNAL FIXATION REMOVAL Left 12/08/2016   Procedure: REMOVAL OF EXTERNAL FIXATION LEFT ANKLE;  Surgeon:  Toni Arthurs, MD;  Location: [facility];  Service: Orthopedics;  Laterality: Left;  . I&D EXTREMITY Left 11/30/2016   Procedure: IRRIGATION AND DEBRIDEMENT LEFT ANKLE;  Surgeon: Toni Arthurs, MD;  Location: [facility];  Service: Orthopedics;  Laterality: Left;  . I&D EXTREMITY Left 12/08/2016   Procedure: IRRIGATION AND DEBRIDEMENT OPEN FRACTURE LEFT ANKLE;  Surgeon: Toni Arthurs, MD;  Location: [facility];  Service: Orthopedics;  Laterality: Left;  . NECK SURGERY    . ORIF FIBULA FRACTURE Left 12/08/2016   Procedure: OPEN REDUCTION INTERNAL FIXATION (ORIF) LEFT PILON AND FIBULA FRACTURE;  Surgeon: Toni Arthurs, MD;  Location: [facility];  Service: Orthopedics;  Laterality: Left;        Home Medications    Prior to Admission medications   Not on File    Family History Family History  Problem Relation Age of Onset  . Heart disease Mother   . Heart disease Father     Social History Social History   Tobacco Use  . Smoking status: Current Every Day Smoker    Packs/day: 1.00    Types: Cigarettes  . Smokeless tobacco: Former Neurosurgeon    Types: Snuff  Substance Use Topics  . Alcohol use: Yes    Alcohol/week: 126.0 standard drinks    Types: 84 Cans of beer, 42 Shots of liquor per week    Comment: drinks as much as possible all day until past Midnight  . Drug use: Yes    Types: Marijuana, Benzodiazepines  Comment: last smoked 1 year ago     Allergies   Patient has no known allergies.   Review of Systems Review of Systems  Constitutional: Negative for chills and fever.  HENT: Negative for ear pain and sore throat.   Eyes: Negative for pain and visual disturbance.  Respiratory: Negative for cough and shortness of breath.   Cardiovascular: Negative for chest pain and palpitations.  Gastrointestinal: Negative for abdominal pain and vomiting.  Genitourinary: Negative for dysuria and hematuria.  Musculoskeletal: Negative for arthralgias and  back pain.  Skin: Negative for color change and rash.  Neurological: Negative for seizures and syncope.  Psychiatric/Behavioral: Positive for suicidal ideas. Negative for agitation, behavioral problems and confusion. The patient is nervous/anxious.   All other systems reviewed and are negative.    Physical Exam Updated Vital Signs There were no vitals taken for this visit.  Physical Exam Vitals signs and nursing note reviewed.  Constitutional:      Appearance: He is well-developed.  HENT:     Head: Normocephalic and atraumatic.     Nose: Nose normal.     Mouth/Throat:     Mouth: Mucous membranes are moist.  Eyes:     Conjunctiva/sclera: Conjunctivae normal.     Pupils: Pupils are equal, round, and reactive to light.  Neck:     Musculoskeletal: Normal range of motion and neck supple.  Cardiovascular:     Rate and Rhythm: Normal rate and regular rhythm.     Heart sounds: No murmur.  Pulmonary:     Effort: Pulmonary effort is normal. No respiratory distress.     Breath sounds: Normal breath sounds.  Abdominal:     Palpations: Abdomen is soft.     Tenderness: There is no abdominal tenderness.  Skin:    General: Skin is warm and dry.     Coloration: Skin is not jaundiced.  Neurological:     Mental Status: He is alert.  Psychiatric:        Thought Content: Thought content includes suicidal ideation. Thought content does not include suicidal plan.      ED Treatments / Results  Labs (all labs ordered are listed, but only abnormal results are displayed) Labs Reviewed  COMPREHENSIVE METABOLIC PANEL - Abnormal; Notable for the following components:      Result Value   Total Protein 8.5 (*)    AST 42 (*)    All other components within normal limits  ETHANOL - Abnormal; Notable for the following components:   Alcohol, Ethyl (B) 14 (*)    All other components within normal limits  PROTIME-INR - Abnormal; Notable for the following components:   Prothrombin Time >90.0 (*)     INR >10.0 (*)    All other components within normal limits  ACETAMINOPHEN LEVEL - Abnormal; Notable for the following components:   Acetaminophen (Tylenol), Serum <10 (*)    All other components within normal limits  SARS CORONAVIRUS 2 BY RT PCR (HOSPITAL ORDER, [place] [facility])  CBC WITH DIFFERENTIAL/PLATELET  LIPASE, BLOOD  PROTIME-INR  RAPID URINE DRUG SCREEN, HOSP PERFORMED    EKG None  Radiology No results found.  Procedures Procedures (including critical care time)  Medications Ordered in ED Medications - No data to display   Initial Impression / Assessment and Plan / ED Course  I have reviewed the triage vital signs and the nursing notes.  Pertinent labs & imaging results that were available during my care of the patient were reviewed  by me and considered in my medical decision making (see chart for details).     Jeffrey Braun is a 33 year old male with history of hepatitis who presents the ED for medical clearance.  Patient seen by behavioral health and IVC was placed.  Patient with unremarkable vitals.  No fever.  Patient with suicidal thoughts.  History of drug abuse.  States that he was hospitalized for hepatitis A recently.  Patient to get lab work to reevaluate for any liver injury or other acute process.  Does not appear to have any jaundice on exam.  Will get coronavirus test as patient [facility].  Will reevaluate after lab work.  Liver enzymes back to normal.  Bilirubin normal.  Platelets normal.  INR was 10 but appeared to be a lab error given H and P and other lab work, repeat INR is 0.9.  This appears more consistent as patient has other liver function that appears normal at this time.  He does not have any bleeding. INR was normal several weeks ago. Overall lab work is unremarkable and patient is medically cleared at this time.  Appears to have recovered from previous hepatitis A given normalization of his  liver enzymes.  COVID test has been sent for.  Patient awaiting behavioral health admission at this time.  This chart was dictated using voice recognition software.  Despite best efforts to proofread,  errors can occur which can change the documentation meaning.    Final Clinical Impressions(s) / ED Diagnoses   Final diagnoses:  Suicidal ideation    ED Discharge Orders    None       Virgina NorfolkCuratolo, Kaeleen Odom, DO 01/16/19 1326

## 2019-01-16 NOTE — ED Notes (Signed)
Ulice Dash from [place] called- okay to return to [facility] after medical clearance.

## 2019-01-16 NOTE — ED Notes (Signed)
Pt DC off unit to home. Pt alert, calm, cooperative, no s/s of distress. Pt transported to [facility] by [employer]. DC information and belongings given to [employer] for transport. Pt ambulatory off unit.

## 2019-01-16 NOTE — ED Triage Notes (Signed)
Patient reports with [employer] under IVC. Patient reports he has been having suicidal thoughts but no intention on acting on them. Patient reports he has 4 children and has no means to take care of them and nowhere to stay.  Patient reports hx of drug use. Patient reports he had been clean and then with the stress he used meth this past week.  Patient is visibly upset in triage and reports there are concerns for his childrens safety and that the mother has been found doing pornography and she has custody of 2 of his children.

## 2019-01-16 NOTE — ED Notes (Addendum)
Pt Belongings: 3 belonging bags [jeans(wallet), a shirt,black phone, jacket and brown boots **[employer]**

## 2019-01-16 NOTE — ED Notes (Addendum)
Pt calm , cooperative

## 2019-01-16 NOTE — ED Notes (Addendum)
Date and time results received: 01/16/19 12:15 PM  (use smartphrase ".now" to insert current time)  Test: INR >10, PT >90 Critical Value:   Name of Provider Notified: Curatolo  Orders Received? Or Actions Taken?: awaiting orders

## 2019-01-21 ENCOUNTER — Encounter (HOSPITAL_COMMUNITY): Payer: Self-pay

## 2019-01-21 ENCOUNTER — Emergency Department (HOSPITAL_COMMUNITY)
Admission: EM | Admit: 2019-01-21 | Discharge: 2019-01-22 | Disposition: A | Discharge status: Home / Self Care | Payer: Self-pay | Attending: Emergency Medicine | Admitting: Emergency Medicine

## 2019-01-21 ENCOUNTER — Other Ambulatory Visit: Payer: Self-pay

## 2019-01-21 DIAGNOSIS — R45851 Suicidal ideations: Secondary | ICD-10-CM | POA: Insufficient documentation

## 2019-01-21 DIAGNOSIS — F1994 Other psychoactive substance use, unspecified with psychoactive substance-induced mood disorder: Secondary | ICD-10-CM

## 2019-01-21 DIAGNOSIS — F1020 Alcohol dependence, uncomplicated: Secondary | ICD-10-CM | POA: Diagnosis present

## 2019-01-21 DIAGNOSIS — F17210 Nicotine dependence, cigarettes, uncomplicated: Secondary | ICD-10-CM | POA: Insufficient documentation

## 2019-01-21 DIAGNOSIS — T50902A Poisoning by unspecified drugs, medicaments and biological substances, intentional self-harm, initial encounter: Secondary | ICD-10-CM | POA: Insufficient documentation

## 2019-01-21 DIAGNOSIS — Z79899 Other long term (current) drug therapy: Secondary | ICD-10-CM | POA: Insufficient documentation

## 2019-01-21 DIAGNOSIS — Z046 Encounter for general psychiatric examination, requested by authority: Secondary | ICD-10-CM | POA: Insufficient documentation

## 2019-01-21 DIAGNOSIS — F1910 Other psychoactive substance abuse, uncomplicated: Secondary | ICD-10-CM

## 2019-01-21 DIAGNOSIS — F329 Major depressive disorder, single episode, unspecified: Secondary | ICD-10-CM | POA: Insufficient documentation

## 2019-01-21 DIAGNOSIS — Z7289 Other problems related to lifestyle: Secondary | ICD-10-CM | POA: Insufficient documentation

## 2019-01-21 NOTE — ED Triage Notes (Signed)
[place] gas station after calling 911 stating he attempted suicide. Roughly 1-2 hours ago reports taking a mixture of 5 pills of possibly trazodone, quetiapine, and escitalopram.  States he has had a lot of thing going on recently but will not give any other details.

## 2019-01-22 ENCOUNTER — Encounter (HOSPITAL_COMMUNITY): Payer: Self-pay | Admitting: Registered Nurse

## 2019-01-22 DIAGNOSIS — F1994 Other psychoactive substance use, unspecified with psychoactive substance-induced mood disorder: Secondary | ICD-10-CM

## 2019-01-22 DIAGNOSIS — F1910 Other psychoactive substance abuse, uncomplicated: Secondary | ICD-10-CM

## 2019-01-22 LAB — COMPREHENSIVE METABOLIC PANEL
ALT: 22 U/L (ref 0–44)
AST: 36 U/L (ref 15–41)
Albumin: 4.5 g/dL (ref 3.5–5.0)
Alkaline Phosphatase: 79 U/L (ref 38–126)
Anion gap: 11 (ref 5–15)
BUN: 18 mg/dL (ref 6–20)
CO2: 26 mmol/L (ref 22–32)
Calcium: 9.5 mg/dL (ref 8.9–10.3)
Chloride: 101 mmol/L (ref 98–111)
Creatinine, Ser: 0.91 mg/dL (ref 0.61–1.24)
GFR calc Af Amer: >60 mL/min (ref >60)
GFR calc non Af Amer: >60 mL/min (ref >60)
Glucose, Bld: 129 mg/dL — ABNORMAL HIGH (ref 70–99)
Potassium: 3.5 mmol/L (ref 3.5–5.1)
Sodium: 138 mmol/L (ref 135–145)
Total Bilirubin: 0.6 mg/dL (ref 0.3–1.2)
Total Protein: 7.7 g/dL (ref 6.5–8.1)

## 2019-01-22 LAB — CBC
HCT: 41.9 % (ref 39.0–52.0)
Hemoglobin: 14.9 g/dL (ref 13.0–17.0)
MCH: 33.4 pg (ref 26.0–34.0)
MCHC: 35.6 g/dL (ref 30.0–36.0)
MCV: 93.9 fL (ref 80.0–100.0)
Platelets: 251 10*3/uL (ref 150–400)
RBC: 4.46 MIL/uL (ref 4.22–5.81)
RDW: 12.5 % (ref 11.5–15.5)
WBC: 9.3 10*3/uL (ref 4.0–10.5)
nRBC: 0 % (ref 0.0–0.2)

## 2019-01-22 LAB — RAPID URINE DRUG SCREEN, HOSP PERFORMED
Amphetamines: POSITIVE — AB
Barbiturates: NOT DETECTED
Benzodiazepines: POSITIVE — AB
Cocaine: NOT DETECTED
Opiates: NOT DETECTED
Tetrahydrocannabinol: NOT DETECTED

## 2019-01-22 LAB — ACETAMINOPHEN LEVEL: Acetaminophen (Tylenol), Serum: <10 ug/mL — ABNORMAL LOW (ref 10–30)

## 2019-01-22 LAB — SALICYLATE LEVEL: Salicylate Lvl: <7 mg/dL (ref 2.8–30.0)

## 2019-01-22 LAB — ETHANOL: Alcohol, Ethyl (B): <10 mg/dL (ref <10)

## 2019-01-22 NOTE — ED Notes (Signed)
Pt was given resources and discharged safely.  Pt does not have an ID so he is unable to go to [facility] at this time.  All belongings were returned to patient.

## 2019-01-22 NOTE — ED Notes (Signed)
TTS machine set up for assessment

## 2019-01-22 NOTE — ED Notes (Signed)
Pt is calm and cooperative.  He is tearful at times.  Pt contracts for safety   15 minute checks and video monitoring in place.

## 2019-01-22 NOTE — ED Notes (Addendum)
Pt asleep. Unable to collect UDS

## 2019-01-22 NOTE — [facility] Suicide Risk Assessment (Cosign Needed)
Suicide Risk Assessment  Discharge Assessment   [facility] Discharge Suicide Risk Assessment   Principal Problem: Substance induced mood disorder ([place]) Discharge Diagnoses: Principal Problem:   Substance induced mood disorder ([place]) Active Problems:   Alcoholism ([place])   Polysubstance abuse ([place])   Total Time spent with patient: 30 minutes  Musculoskeletal: Strength & Muscle Tone: within normal limits Gait & Station: normal Patient leans: N/A  Psychiatric Specialty Exam:   Blood pressure 120/82, pulse 60, temperature (!) 97.5 F (36.4 C), temperature source Oral, resp. rate 12, SpO2 99 %.There is no height or weight on file to calculate BMI.  General Appearance: Casual  Eye Contact::  Good  Speech:  Blocked and Normal Rate409  Volume:  Normal  Mood:  Anxious and "Fine" Appropriate  Affect:  Appropriate and Congruent  Thought Process:  Coherent, Goal Directed and Descriptions of Associations: Intact  Orientation:  Full (Time, Place, and Person)  Thought Content:  WDL and Denies hallucinations, delusions, and paranoia  Suicidal Thoughts:  No  Homicidal Thoughts:  No  Memory:  Immediate;   Good Recent;   Good  Judgement:  Intact  Insight:  Present  Psychomotor Activity:  Normal  Concentration:  Good  Recall:  Good  Fund of Knowledge:Good  Language: Good  Akathisia:  No  Handed:  Right  AIMS (if indicated):     Assets:  Communication Skills Desire for Improvement Housing Resilience Social Support  Sleep:     Cognition: WNL  ADL's:  Intact   Mental Status Per Nursing Assessment::   On Admission:    Jeffrey Braun, 33 y.o., male patient seen via tele psych by this provider, Dr. Dwyane Dee; and chart reviewed on 01/22/19.  On evaluation Jeffrey Braun reports that he came back to the hospital because he feels he needs to be put in a substance use rehab program that is going to offer him consistent treatment for his substance abuse in order to get his life back together."  I  have been in a program they kept me log enough to get clean and stay clean.  No I am not suicidal of just not been able to get things done for myself or get my life together."  Patient denies suicidal/self-harm/homicidal ideations, psychosis, paranoia.  Patient states that he is interested in the [employer] (victory program). During evaluation Jeffrey Braun is alert/oriented x 4; calm/cooperative; and mood is congruent with affect.  He does not appear to be responding to internal/external stimuli or delusional thoughts.  Patient denies suicidal/self-harm/homicidal ideation, psychosis, and paranoia.  Patient answered question appropriately.     Demographic Factors:  Male, Caucasian, Low socioeconomic status and Unemployed  Loss Factors: Financial problems/change in socioeconomic status  Historical Factors: Impulsivity  Risk Reduction Factors:   Sense of responsibility to family, Religious beliefs about death and Living with another person, especially a relative  Continued Clinical Symptoms:  Alcohol/Substance Abuse/Dependencies  Cognitive Features That Contribute To Risk:  None    Suicide Risk:  Minimal: No identifiable suicidal ideation.  Patients presenting with no risk factors but with morbid ruminations; may be classified as minimal risk based on the severity of the depressive symptoms    Plan Of Care/Follow-up recommendations:  Activity:  As tolerated Diet:  Heart healthy  Wynnie Pacetti, NP 01/22/2019, 12:13 PM

## 2019-01-22 NOTE — Discharge Instructions (Signed)
For your behavioral health needs, you are advised to follow up with the [facility].  Contact them at your earliest opportunity for referral to providers in your community.  Please note that the number listed below also serves as a 24 hour crisis number:       [facility]      [phone]

## 2019-01-22 NOTE — ED Provider Notes (Signed)
[facility] DEPT Provider Note   CSN: [id number] Arrival date & time: 01/21/19  2332     History   Chief Complaint Chief Complaint  Patient presents with  . Suicidal  . Drug Overdose    HPI Jeffrey Braun is a 33 y.o. male.     Patient is a 33 year old male with past medical history of Hepatitis A, polysubstance abuse, major depression.  He presents today with complaints of overdose.  Patient states that he took a combination of trazodone, Seroquel, and Lexapro.  He tells me he did this in an attempt to harm himself.  Patient states that he has ongoing depression and polysubstance abuse.    The history is provided by the patient.  Drug Overdose This is a new problem. The current episode started 3 to 5 hours ago. The problem occurs constantly. The problem has not changed since onset.Nothing aggravates the symptoms. Nothing relieves the symptoms. He has tried nothing for the symptoms.    Past Medical History:  Diagnosis Date  . Acute liver failure 11/2018  . Alcoholic hepatitis 86/5784  . Neck pain     Patient Active Problem List   Diagnosis Date Noted  . Acute hepatitis A virus infection 11/28/2018  . Acute liver failure without hepatic coma 11/28/2018  . Alcoholism ([place]) 11/28/2018  . Drug use 11/28/2018  . Fever 11/28/2018  . Thrombocytopenia ([place]) 11/28/2018  . Mild major depression ([place]) 11/28/2018  . Alcoholic hepatitis without ascites 11/28/2018  . 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 Complication of external fixation device with internal components ([place]) 11/30/2016  . Bimalleolar ankle fracture, left, open type III, initial encounter 11/30/2016    Past Surgical History:  Procedure Laterality Date  . ABDOMINAL SURGERY    . CLEFT PALATE REPAIR    . EXTERNAL FIXATION LEG Left 11/30/2016   Procedure: APPLICATION EXTERNAL FIXATION LEFT  LEG;  Surgeon: Wylene Simmer, MD;  Location: [facility];  Service: Orthopedics;  Laterality: Left;  .  EXTERNAL FIXATION REMOVAL Left 12/08/2016   Procedure: REMOVAL OF EXTERNAL FIXATION LEFT ANKLE;  Surgeon: Wylene Simmer, MD;  Location: [facility];  Service: Orthopedics;  Laterality: Left;  . I&D EXTREMITY Left 11/30/2016   Procedure: IRRIGATION AND DEBRIDEMENT LEFT ANKLE;  Surgeon: Wylene Simmer, MD;  Location: [facility];  Service: Orthopedics;  Laterality: Left;  . I&D EXTREMITY Left 12/08/2016   Procedure: IRRIGATION AND DEBRIDEMENT OPEN FRACTURE LEFT ANKLE;  Surgeon: Wylene Simmer, MD;  Location: [facility];  Service: Orthopedics;  Laterality: Left;  . NECK SURGERY    . ORIF FIBULA FRACTURE Left 12/08/2016   Procedure: OPEN REDUCTION INTERNAL FIXATION (ORIF) LEFT PILON AND FIBULA FRACTURE;  Surgeon: Wylene Simmer, MD;  Location: [facility];  Service: Orthopedics;  Laterality: Left;        Home Medications    Prior to Admission medications   Not on File    Family History Family History  Problem Relation Age of Onset  . Heart disease Mother   . Heart disease Father     Social History Social History   Tobacco Use  . Smoking status: Current Every Day Smoker    Packs/day: 1.00    Types: Cigarettes  . Smokeless tobacco: Former Systems developer    Types: Snuff  Substance Use Topics  . Alcohol use: Yes    Alcohol/week: 126.0 standard drinks    Types: 84 Cans of beer, 42 Shots of liquor per week    Comment: drinks as  much as possible all day until past Midnight  . Drug use: Yes    Types: Marijuana, Benzodiazepines    Comment: last smoked 1 year ago     Allergies   Patient has no known allergies.   Review of Systems Review of Systems  All other systems reviewed and are negative.    Physical Exam Updated Vital Signs BP 134/89 (BP Location: Left Arm)   Pulse 78   Temp (!) 97.5 F (36.4 C) (Oral)   Resp 13   SpO2 99%   Physical Exam Vitals signs and nursing note reviewed.  Constitutional:      General: He is not in acute distress.     Appearance: He is well-developed. He is not diaphoretic.  HENT:     Head: Normocephalic and atraumatic.  Neck:     Musculoskeletal: Normal range of motion and neck supple.  Cardiovascular:     Rate and Rhythm: Normal rate and regular rhythm.     Heart sounds: No murmur. No friction rub.  Pulmonary:     Effort: Pulmonary effort is normal. No respiratory distress.     Breath sounds: Normal breath sounds. No wheezing or rales.  Abdominal:     General: Bowel sounds are normal. There is no distension.     Palpations: Abdomen is soft.     Tenderness: There is no abdominal tenderness.  Musculoskeletal: Normal range of motion.  Skin:    General: Skin is warm and dry.  Neurological:     General: No focal deficit present.     Mental Status: He is alert and oriented to person, place, and time.     Cranial Nerves: No cranial nerve deficit.     Motor: No weakness.     Coordination: Coordination normal.  Psychiatric:        Attention and Perception: Attention and perception normal.        Mood and Affect: Mood normal.        Speech: Speech normal.        Behavior: Behavior is slowed and withdrawn.        Thought Content: Thought content includes suicidal ideation. Thought content includes suicidal plan.      ED Treatments / Results  Labs (all labs ordered are listed, but only abnormal results are displayed) Labs Reviewed  COMPREHENSIVE METABOLIC PANEL  ETHANOL  SALICYLATE LEVEL  ACETAMINOPHEN LEVEL  CBC  RAPID URINE DRUG SCREEN, HOSP PERFORMED    EKG None  Radiology No results found.  Procedures Procedures (including critical care time)  Medications Ordered in ED Medications - No data to display   Initial Impression / Assessment and Plan / ED Course  I have reviewed the triage vital signs and the nursing notes.  Pertinent labs & imaging results that were available during my care of the patient were reviewed by me and considered in my medical decision making (see chart  for details).  Patient brought here after an overdose of his psychiatric medications as described in the HPI.  Patient's work-up is unremarkable and he has been observed overnight in the ER.  He appears medically cleared for evaluation by TTS.  Final Clinical Impressions(s) / ED Diagnoses   Final diagnoses:  None    ED Discharge Orders    None       Geoffery Lyons, MD 01/22/19 [phone]

## 2019-01-22 NOTE — Patient Outreach (Signed)
ED Peer Support Specialist Patient Intake (Complete at intake & 30-60 Day Follow-up)  Name: Jeffrey Braun  MRN: [id number]  Age: 33 y.o.   Date of Admission: 01/22/2019  Intake: Initial Comments:      Primary Reason Admitted: Suicidal, Drug Overdose   Lab values: Alcohol/ETOH: Negative Positive UDS? Yes Amphetamines: Yes Barbiturates: No Benzodiazepines: No Cocaine: No Opiates: No Cannabinoids: Yes  Demographic information: Gender: Male Ethnicity: White Marital Status: Separated Insurance Status: Uninsured/Self-pay Ecologist (Work Neurosurgeon, Physicist, medical, etc.: No Lives with: Alone Living situation: Homeless  Reported Patient History: Patient reported health conditions: Depression Patient aware of HIV and hepatitis status: No  In past year, has patient visited ED for any reason? Yes  Number of ED visits: 3  Reason(s) for visit: various reasons  In past year, has patient been hospitalized for any reason? No  Number of hospitalizations:    Reason(s) for hospitalization:    In past year, has patient been arrested? No  Number of arrests:    Reason(s) for arrest:    In past year, has patient been incarcerated? No  Number of incarcerations:    Reason(s) for incarceration:    In past year, has patient received medication-assisted treatment? No  In past year, patient received the following treatments:    In past year, has patient received any harm reduction services? No  Did this include any of the following?    In past year, has patient received care from a mental health provider for diagnosis other than SUD? No  In past year, is this first time patient has overdosed? No  Number of past overdoses:    In past year, is this first time patient has been hospitalized for an overdose? No  Number of hospitalizations for overdose(s):    Is patient currently receiving treatment for a mental health diagnosis? No  Patient  reports experiencing difficulty participating in SUD treatment: No    Most important reason(s) for this difficulty?    Has patient received prior services for treatment? No  In past, patient has received services from following agencies:    Plan of Care:  Suggested follow up at these agencies/treatment centers: Other (comment)([facility])  Other information: CPSS met with Pt and was able to use motivational interviewing and was able to gain information to better assist Pt. CPSS was made aware that Pt wants to better the quality of his life by seeking help. Pt stated that he was informed about [facility] and was made aware that he may gain from utilizing these services. CPSS processed with Pt about transportation, tp make sure that Pt is understanding the steps to get to the facility.    [name], [place]  01/22/2019 12:12 PM

## 2019-01-22 NOTE — ED Notes (Signed)
Patient states he is tired, denies SI at this time-assisted patient to the bathroom
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
## 2019-01-22 NOTE — BH Assessment (Signed)
[facility] Assessment Progress Note  Per Hampton Abbot, MD, this pt does not require psychiatric hospitalization at this time.  Pt is to be discharged from [facility] after being seen by peer support, with whom a consult has been placed.  Discharge instructions include referral information for the [facility].  Pt's nurse, Nena Jordan, has been notified.  Jalene Mullet, [place] Triage Specialist [phone]

## 2019-02-11 ENCOUNTER — Encounter: Payer: Self-pay | Admitting: Internal Medicine

## 2023-10-20 ENCOUNTER — Emergency Department (HOSPITAL_BASED_OUTPATIENT_CLINIC_OR_DEPARTMENT_OTHER)
Admission: EM | Admit: 2023-10-20 | Discharge: 2023-10-20 | Disposition: A | Discharge status: Home / Self Care | Attending: Emergency Medicine | Admitting: Emergency Medicine

## 2023-10-20 ENCOUNTER — Encounter (HOSPITAL_BASED_OUTPATIENT_CLINIC_OR_DEPARTMENT_OTHER): Payer: Self-pay

## 2023-10-20 ENCOUNTER — Emergency Department (HOSPITAL_BASED_OUTPATIENT_CLINIC_OR_DEPARTMENT_OTHER)

## 2023-10-20 ENCOUNTER — Other Ambulatory Visit: Payer: Self-pay

## 2023-10-20 DIAGNOSIS — W458XXA Other foreign body or object entering through skin, initial encounter: Secondary | ICD-10-CM | POA: Diagnosis not present

## 2023-10-20 DIAGNOSIS — S50851A Superficial foreign body of right forearm, initial encounter: Secondary | ICD-10-CM | POA: Insufficient documentation

## 2023-10-20 DIAGNOSIS — T148XXA Other injury of unspecified body region, initial encounter: Secondary | ICD-10-CM

## 2023-10-20 MED ORDER — LIDOCAINE-EPINEPHRINE 2 %-1:100000 IJ SOLN: 1.7000 mL | Freq: Once | INTRAMUSCULAR | Status: DC

## 2023-10-20 MED ORDER — LIDOCAINE-EPINEPHRINE (PF) 2 %-1:200000 IJ SOLN
INTRAMUSCULAR | Status: AC
  Administered 2023-10-20: 20 mL
  Filled 2023-10-20: qty 20

## 2023-10-20 NOTE — ED Provider Notes (Signed)
 [place] EMERGENCY DEPARTMENT AT [facility] Provider Note   CSN: [id number] Arrival date & time: 10/20/23  1452     Patient presents with: Foreign Body in Skin   Jeffrey Braun is a 38 y.o. male.   38 y.o male with no PMH presents to the ED with a chief complaint of foreign body to his right forearm.  Patient currently works at a [employer], mental health, reports he sometimes gets fiberglass into his skin, he has had this removed in the hospital priorly.  He reports he is unable to reach this as it is actually laying on his right arm, and he is right-hand dominant.  He reports pain to the area, especially with any type of palpation.  Reports significant irritation to it daily.  No other complaints reported.  The history is provided by the patient.       Prior to Admission medications   Medication Sig Start Date End Date Taking? Authorizing Provider  escitalopram (LEXAPRO) 10 MG tablet Take 10 mg by mouth daily.    [provider]  QUEtiapine (SEROQUEL) 100 MG tablet Take 100 mg by mouth at bedtime.    [provider]  traZODone  (DESYREL ) 50 MG tablet Take 50 mg by mouth at bedtime.    [provider]    Allergies: Patient has no known allergies.    Review of Systems  Constitutional:  Negative for fever.  Skin:  Positive for wound.    Updated Vital Signs BP 132/83 (BP Location: Right Arm)   Pulse 94   Temp 98.5 F (36.9 C) (Oral)   Resp 18   Ht 5' 10 (1.778 m)   Wt 101.6 kg   SpO2 100%   BMI 32.14 kg/m   Physical Exam Vitals and nursing note reviewed.  Constitutional:      Appearance: Normal appearance.  HENT:     Head: Normocephalic and atraumatic.     Mouth/Throat:     Mouth: Mucous membranes are moist.  Cardiovascular:     Rate and Rhythm: Normal rate.  Pulmonary:     Effort: Pulmonary effort is normal.  Abdominal:     General: Abdomen is flat.  Musculoskeletal:     Cervical back: Normal range of motion and neck  supple.  Skin:    General: Skin is warm and dry.     Findings: No erythema.     Comments: Small piece of fiberglass noted in the right forearm.   Neurological:     Mental Status: He is alert and oriented to person, place, and time.     (all labs ordered are listed, but only abnormal results are displayed) Labs Reviewed - No data to display  EKG: None  Radiology: DG Wrist Complete Right Result Date: 10/20/2023 CLINICAL DATA:  pain EXAM: RIGHT WRIST - COMPLETE 3+ VIEW COMPARISON:  None Available. FINDINGS: No acute fracture or dislocation. There is no evidence of arthropathy or other focal bone abnormality. Soft tissues are unremarkable. IMPRESSION: No acute fracture or dislocation. Electronically Signed   By: Rogelia Myers M.D.   On: 10/20/2023 15:46     .Foreign Body Removal  Date/Time: 10/20/2023 5:04 PM  Performed by: Kellsey Sansone, PA-C Authorized by: Hira Trent, PA-C  Consent given by: patient Patient identity confirmed: verbally with patient Body area: skin General location: upper extremity Location details: right forearm Anesthesia: local infiltration  Anesthesia: Local Anesthetic: lidocaine  2% with epinephrine  Anesthetic total: 3 mL Tendon involvement: none Post-procedure assessment: foreign body not removed Patient  tolerance: patient tolerated the procedure well with no immediate complications     Medications Ordered in the ED  lidocaine -EPINEPHrine  (XYLOCAINE  W/EPI) 2 %-1:200000 (PF) injection (20 mLs  Given 10/20/23 1645)                                    Medical Decision Making Amount and/or Complexity of Data Reviewed Radiology: ordered.  Risk Prescription drug management.   Patient presented to the ED with a chief complaint of foreign body to the right forearm, reports working at a [employer], states that he notes fiberglass pieces usually will dislodge in his skin, he has not been able to remove this.  He is here requesting removal,  reports he has had this done in the emergency department previously.  I attempted to remove patient's foreign body, however I was unable to visualize this, he did have nothing medications such as lidocaine  with epinephrine .  He will follow-up with his primary care doctor as needed.       Final diagnoses:  Foreign body in skin    ED Discharge Orders     None          Maureen Broad, PA-C 10/20/23 1704    Ruthe Cornet, DO 10/20/23 2024
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
## 2024-03-14 ENCOUNTER — Other Ambulatory Visit (HOSPITAL_BASED_OUTPATIENT_CLINIC_OR_DEPARTMENT_OTHER): Payer: Self-pay

## 2024-03-14 ENCOUNTER — Emergency Department (HOSPITAL_BASED_OUTPATIENT_CLINIC_OR_DEPARTMENT_OTHER)
Admission: EM | Admit: 2024-03-14 | Discharge: 2024-03-14 | Disposition: A | Discharge status: Home / Self Care | Attending: Emergency Medicine | Admitting: Emergency Medicine

## 2024-03-14 ENCOUNTER — Other Ambulatory Visit: Payer: Self-pay

## 2024-03-14 ENCOUNTER — Encounter (HOSPITAL_BASED_OUTPATIENT_CLINIC_OR_DEPARTMENT_OTHER): Payer: Self-pay | Admitting: Emergency Medicine

## 2024-03-14 DIAGNOSIS — Z760 Encounter for issue of repeat prescription: Secondary | ICD-10-CM | POA: Diagnosis not present

## 2024-03-14 DIAGNOSIS — M25572 Pain in left ankle and joints of left foot: Secondary | ICD-10-CM | POA: Insufficient documentation

## 2024-03-14 MED ORDER — QUETIAPINE FUMARATE 100 MG PO TABS
100.0000 mg | ORAL_TABLET | Freq: Every day | ORAL | 0 refills | Status: AC
  Filled 2024-03-14: qty 30, 30d supply, fill #0

## 2024-03-14 NOTE — Discharge Instructions (Signed)
 You were seen in the ER today for concerns of ankle pain. Unfortunately, we are unable to provide you with a joint injection in the ankle here today. You could try to see if the [facility] urgent care can be more helpful if you are looking to have a steroid injection of the ankle performed. I can't guarantee that they will be able to help but that is likely your best bet for same day treatment if this is what you are looking for.  I have refilled your Seroquel for you. Please take this as directed.

## 2024-03-14 NOTE — ED Provider Notes (Signed)
 [place] EMERGENCY DEPARTMENT AT [facility] Provider Note   CSN: [id number] Arrival date & time: 03/14/24  9047     Patient presents with: Ankle Pain   Jeffrey Braun is a 38 y.o. male.  Patient with history significant for bimalleolar fracture in the left ankle, substance use disorder presents ED with concerns of ankle pain.  Reports experiencing ankle pain in the left ankle for about 1-1/2 weeks.  Denies any injury or trauma.  No reported falls, rolling the ankle, or other impact to the area.  Has been trying NSAIDs without significant improvement in symptoms. States that he previously had surgery on the ankle about 5 years ago performed at [facility] but has not followed up since surgery.    Ankle Pain      Prior to Admission medications  Medication Sig Start Date End Date Taking? Authorizing Provider  QUEtiapine (SEROQUEL) 100 MG tablet Take 1 tablet (100 mg total) by mouth at bedtime. 03/14/24 04/13/24 Yes Izaan Kingbird A, PA-C  escitalopram (LEXAPRO) 10 MG tablet Take 10 mg by mouth daily.    [provider]  QUEtiapine (SEROQUEL) 100 MG tablet Take 100 mg by mouth at bedtime.    [provider]  traZODone  (DESYREL ) 50 MG tablet Take 50 mg by mouth at bedtime.    [provider]    Allergies: Patient has no known allergies.    Review of Systems  Musculoskeletal:        Ankle pain  All other systems reviewed and are negative.   Updated Vital Signs BP (!) 159/97 (BP Location: Right Arm)   Pulse 74   Temp 98.4 F (36.9 C) (Oral)   Resp 16   Ht 5' 10 (1.778 m)   Wt 106.1 kg   SpO2 98%   BMI 33.58 kg/m   Physical Exam Vitals and nursing note reviewed.  Constitutional:      General: He is not in acute distress.    Appearance: He is well-developed.  HENT:     Head: Normocephalic and atraumatic.  Eyes:     Conjunctiva/sclera: Conjunctivae normal.  Cardiovascular:     Rate and Rhythm: Normal rate and regular rhythm.     Heart  sounds: No murmur heard. Pulmonary:     Effort: Pulmonary effort is normal. No respiratory distress.     Breath sounds: Normal breath sounds.  Abdominal:     Palpations: Abdomen is soft.     Tenderness: There is no abdominal tenderness.  Musculoskeletal:        General: Tenderness present. No swelling, deformity or signs of injury. Normal range of motion.     Cervical back: Neck supple.     Comments: TTP along the inferior margin of the lateral malleolus. Some pain also present along the heal of the ankle towards operative site.  Skin:    General: Skin is warm and dry.     Capillary Refill: Capillary refill takes less than 2 seconds.  Neurological:     Mental Status: He is alert.  Psychiatric:        Mood and Affect: Mood normal.     (all labs ordered are listed, but only abnormal results are displayed) Labs Reviewed - No data to display  EKG: None  Radiology: No results found.   Procedures   Medications Ordered in the ED - No data to display  Medical Decision Making Risk Prescription drug management.   This patient presents to the ED for concern of ankle pain.  Differential diagnosis includes chronic ankle pain, ankle sprain, postoperative complications    Additional history obtained:  Additional history obtained from chart review   Problem List / ED Course:  Patient presents to the emergency department with concerns of ankle pain.  Past history significant for bimalleolar fracture of the left ankle and substance use history.  Reportedly has been having pain to the left ankle for the last 1-1/2 weeks.  Denies any obvious injury.  No reported calf pain.  States the pain is primarily along the inferior margin of the lateral malleolus as well as along the heel.  States that pain worsens with ambulation and standing.  He does report that he is physically active but has very little activity that is high impact on his legs or  loadbearing. On exam, tenderness to palpation along the lateral malleolus.  No obvious deformity in this area.  No appreciable bruising, swelling, erythema.  No pitting edema seen in the lower leg. Based on exam, primarily want to rule out for concerns of postoperative complication such as hardware issues.  Offered patient x-ray imaging here in the emergency department but he declined.  He is interested in having a steroid injection given in the ankle.  I did inform him that given that I myself and not comfortable performing this injection and neither is the attending physician that I am working with, this likely would not be able to be done here in the emergency department.  I advised that he may be better served by going to one of the walk-in orthopedic clinics/urgent cares.  Provided patient with contact formation for [facility] and advised that they may be able to help but could not guarantee that they would be able to perform a joint injection in the urgent care setting.  Advised patient return to the emergency department for any concerns or new or worsening symptoms.  I have sent a refill of his home Seroquel for sleep given him reporting due to increased pain, sleep is difficult for him at this time.  He is otherwise stable for outpatient follow-up and discharged home.   Social Determinants of Health:  None  Final diagnoses:  Medication refill  Left ankle pain, unspecified chronicity    ED Discharge Orders          Ordered    QUEtiapine (SEROQUEL) 100 MG tablet  Daily at bedtime        03/14/24 1042               Roshelle Traub A, PA-C 03/14/24 1053    Dean Clarity, MD 03/14/24 1405

## 2024-03-14 NOTE — ED Triage Notes (Signed)
 Pt c/o LT ankle pain x 1.5 weeks. Denies recent injury
